# Patient Record
Sex: Female | Born: 1990 | Race: White | Hispanic: No | Marital: Married | Smoking: Never smoker
Health system: Southern US, Community
[De-identification: ages and names within clinical notes are randomized; demographics above are authoritative.]

## PROBLEM LIST (undated history)

## (undated) ENCOUNTER — Inpatient Hospital Stay (HOSPITAL_COMMUNITY): Payer: Self-pay

## (undated) DIAGNOSIS — Z9889 Other specified postprocedural states: Secondary | ICD-10-CM

## (undated) DIAGNOSIS — R1110 Vomiting, unspecified: Secondary | ICD-10-CM

## (undated) DIAGNOSIS — L501 Idiopathic urticaria: Secondary | ICD-10-CM

## (undated) DIAGNOSIS — R51 Headache: Secondary | ICD-10-CM

## (undated) DIAGNOSIS — R112 Nausea with vomiting, unspecified: Secondary | ICD-10-CM

## (undated) DIAGNOSIS — N200 Calculus of kidney: Secondary | ICD-10-CM

## (undated) DIAGNOSIS — IMO0002 Reserved for concepts with insufficient information to code with codable children: Secondary | ICD-10-CM

## (undated) DIAGNOSIS — I479 Paroxysmal tachycardia, unspecified: Secondary | ICD-10-CM

## (undated) DIAGNOSIS — D649 Anemia, unspecified: Secondary | ICD-10-CM

## (undated) DIAGNOSIS — B3741 Candidal cystitis and urethritis: Secondary | ICD-10-CM

## (undated) DIAGNOSIS — Z8619 Personal history of other infectious and parasitic diseases: Secondary | ICD-10-CM

## (undated) DIAGNOSIS — G43909 Migraine, unspecified, not intractable, without status migrainosus: Secondary | ICD-10-CM

## (undated) DIAGNOSIS — A048 Other specified bacterial intestinal infections: Secondary | ICD-10-CM

## (undated) DIAGNOSIS — K219 Gastro-esophageal reflux disease without esophagitis: Secondary | ICD-10-CM

## (undated) DIAGNOSIS — B999 Unspecified infectious disease: Secondary | ICD-10-CM

## (undated) DIAGNOSIS — B9689 Other specified bacterial agents as the cause of diseases classified elsewhere: Secondary | ICD-10-CM

## (undated) DIAGNOSIS — F419 Anxiety disorder, unspecified: Secondary | ICD-10-CM

## (undated) DIAGNOSIS — N760 Acute vaginitis: Secondary | ICD-10-CM

## (undated) HISTORY — DX: Idiopathic urticaria: L50.1

## (undated) HISTORY — DX: Anxiety disorder, unspecified: F41.9

## (undated) HISTORY — DX: Gastro-esophageal reflux disease without esophagitis: K21.9

## (undated) HISTORY — DX: Personal history of other infectious and parasitic diseases: Z86.19

## (undated) HISTORY — DX: Other specified bacterial intestinal infections: A04.8

## (undated) HISTORY — DX: Calculus of kidney: N20.0

---
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
## 2010-11-22 ENCOUNTER — Emergency Department (HOSPITAL_BASED_OUTPATIENT_CLINIC_OR_DEPARTMENT_OTHER)
Admission: EM | Admit: 2010-11-22 | Discharge: 2010-11-23 | Disposition: A | Discharge status: Home / Self Care | Payer: PRIVATE HEALTH INSURANCE | Attending: Emergency Medicine | Admitting: Emergency Medicine

## 2010-11-22 DIAGNOSIS — R109 Unspecified abdominal pain: Secondary | ICD-10-CM | POA: Insufficient documentation

## 2010-11-22 DIAGNOSIS — N9489 Other specified conditions associated with female genital organs and menstrual cycle: Secondary | ICD-10-CM | POA: Insufficient documentation

## 2010-11-22 LAB — WET PREP, GENITAL: Yeast Wet Prep HPF POC: NONE SEEN

## 2010-11-22 LAB — URINALYSIS, ROUTINE W REFLEX MICROSCOPIC
Bilirubin Urine: NEGATIVE
Glucose, UA: NEGATIVE mg/dL
Hgb urine dipstick: NEGATIVE
Protein, ur: NEGATIVE mg/dL
Urobilinogen, UA: 0.2 mg/dL (ref 0.0–1.0)

## 2010-11-25 LAB — GC/CHLAMYDIA PROBE AMP, GENITAL
Chlamydia, DNA Probe: NEGATIVE
GC Probe Amp, Genital: NEGATIVE

## 2011-08-09 ENCOUNTER — Inpatient Hospital Stay (HOSPITAL_COMMUNITY)
Admission: AD | Admit: 2011-08-09 | Discharge: 2011-08-09 | Discharge status: Against Medical Advice | Payer: PRIVATE HEALTH INSURANCE | Source: Ambulatory Visit | Attending: Obstetrics & Gynecology | Admitting: Obstetrics & Gynecology

## 2012-07-28 NOTE — L&D Delivery Note (Addendum)
Delivery Note At 2:51 AM a viable and healthy female was delivered via Vaginal, Spontaneous Delivery (Presentation: ; Occiput Anterior).  APGAR: 8, 9; weight P .   Placenta status: Intact, Spontaneous.  Cord: 3VC with the following complications: None.    Anesthesia: Epidural  Episiotomy: None Lacerations: 2nd degree perineal Suture Repair: 3.0 vicryl rapide Est. Blood Loss (mL): 450cc  Mom to postpartum.  Baby to Couplet care / Skin to Skin.  BOVARD,Yuki Purves 06/18/2013, 3:28 AM  Br/ A+ /POP/RI    With delivery, pt had mild atony.  Uterus would firm up with bimanual massage, but then get atonic again.  Given rectal cytotec for uterine tone.

## 2012-10-24 ENCOUNTER — Inpatient Hospital Stay (HOSPITAL_COMMUNITY)
Admission: AD | Admit: 2012-10-24 | Discharge: 2012-10-24 | Disposition: A | Discharge status: Home / Self Care | Payer: 59 | Source: Ambulatory Visit | Attending: Obstetrics and Gynecology | Admitting: Obstetrics and Gynecology

## 2012-10-24 ENCOUNTER — Encounter (HOSPITAL_COMMUNITY): Payer: Self-pay

## 2012-10-24 DIAGNOSIS — I479 Paroxysmal tachycardia, unspecified: Secondary | ICD-10-CM

## 2012-10-24 DIAGNOSIS — R002 Palpitations: Secondary | ICD-10-CM | POA: Insufficient documentation

## 2012-10-24 DIAGNOSIS — R000 Tachycardia, unspecified: Secondary | ICD-10-CM | POA: Insufficient documentation

## 2012-10-24 DIAGNOSIS — O2390 Unspecified genitourinary tract infection in pregnancy, unspecified trimester: Secondary | ICD-10-CM

## 2012-10-24 DIAGNOSIS — O2341 Unspecified infection of urinary tract in pregnancy, first trimester: Secondary | ICD-10-CM

## 2012-10-24 DIAGNOSIS — O2650 Maternal hypotension syndrome, unspecified trimester: Secondary | ICD-10-CM | POA: Insufficient documentation

## 2012-10-24 HISTORY — DX: Candidal cystitis and urethritis: B37.41

## 2012-10-24 HISTORY — DX: Unspecified infectious disease: B99.9

## 2012-10-24 HISTORY — DX: Other specified bacterial agents as the cause of diseases classified elsewhere: B96.89

## 2012-10-24 HISTORY — DX: Other specified bacterial agents as the cause of diseases classified elsewhere: N76.0

## 2012-10-24 HISTORY — DX: Acute vaginitis: B96.89

## 2012-10-24 HISTORY — DX: Headache: R51

## 2012-10-24 LAB — CBC
HCT: 37.9 % (ref 36.0–46.0)
Hemoglobin: 13 g/dL (ref 12.0–15.0)
MCH: 31 pg (ref 26.0–34.0)
MCHC: 34.3 g/dL (ref 30.0–36.0)
MCV: 90.5 fL (ref 78.0–100.0)
Platelets: 323 10*3/uL (ref 150–400)
RBC: 4.19 MIL/uL (ref 3.87–5.11)
RDW: 13.4 % (ref 11.5–15.5)
WBC: 9.3 10*3/uL (ref 4.0–10.5)

## 2012-10-24 LAB — URINALYSIS, ROUTINE W REFLEX MICROSCOPIC
Bilirubin Urine: NEGATIVE
Hgb urine dipstick: NEGATIVE
Nitrite: NEGATIVE
Protein, ur: NEGATIVE mg/dL
Specific Gravity, Urine: 1.015 (ref 1.005–1.030)
Urobilinogen, UA: 0.2 mg/dL (ref 0.0–1.0)

## 2012-10-24 LAB — URINE MICROSCOPIC-ADD ON

## 2012-10-24 MED ORDER — NITROFURANTOIN MONOHYD MACRO 100 MG PO CAPS: 100.0000 mg | ORAL_CAPSULE | Freq: Two times a day (BID) | ORAL | Status: DC

## 2012-10-24 NOTE — MAU Note (Signed)
Patient states she was in the shower and got hot and sat down in the shower. States she passed out and when she awoke she was disoriented and shaky. Denies pain or bleeding. Patient states she has weaned herself off Nadolol given for her migraines that she took daily. Has had headaches for 4 days.

## 2012-10-24 NOTE — MAU Note (Signed)
Patient presents to MAU with c/o feeling "shaky" and heart palpations since 1230 today when patient passed out in shower. Denies injury resulting from passing out in shower; states she was already lying down.  Reports she is weaning herself from Nadolol, which she takes for migraines. States she normally takes 40 mg and has been weaning herself from the medication per PCP.  States she has been off of med completely for 4 days. Reports she has had migraine x 4 days and heart palpations, relieved by 1000 mg Tylenol and Phenergan taken yesterday at lunch. Heart palpations and shaky feeling persists.  Denies vaginal bleeding, LOF.

## 2012-10-24 NOTE — MAU Provider Note (Signed)
History     CSN: [id number]  Arrival date & time 10/24/12  1333   None     Chief Complaint  Patient presents with  . Loss of Consciousness    (Consider location/radiation/quality/duration/timing/severity/associated sxs/prior treatment) HPI Susan Hines is a 22 y.o. G1P0 at [redacted]w[redacted]d. She presents with c/o feeling shakey and having heart palpitations since 12:30. She lost consciousness while lying in the shower for a brief period of time.  She is weaning off Corgard that she has been on x 4 yr for migraines. Last dose was 4 days ago.  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  No vaginal bleeding or spotting, has low cramping off/on x 1 1/2 wks.   Past Medical History  Diagnosis Date  . Headache   . Infection   . Yeast cystitis   . Bacterial vaginosis     Past Surgical History  Procedure Laterality Date  . Wisdom tooth extraction      No family history on file.  History  Substance Use Topics  . Smoking status: Never Smoker   . Smokeless tobacco: Never Used  . Alcohol Use: No    OB History   Grav Para Term Preterm Abortions TAB SAB Ect Mult Living   1               Review of Systems  Constitutional: Negative for fever and chills.  Gastrointestinal: Positive for nausea and constipation. Negative for vomiting and diarrhea.  Genitourinary: Positive for frequency. Negative for dysuria, urgency, vaginal bleeding and vaginal discharge.       Cramping off/on  Neurological: Negative for tremors, seizures, facial asymmetry, speech difficulty, light-headedness and headaches.    Allergies  Augmentin  Home Medications  No current outpatient prescriptions on file.  BP 126/72  Pulse 115  Temp(Src) 98.5 F (36.9 C) (Oral)  Resp 16  Ht 5' (1.524 m)  Wt 158 lb (71.668 kg)  BMI 30.86 kg/m2  SpO2 100%  LMP 09/15/2012  Physical Exam  Nursing note and vitals reviewed. Constitutional: She is oriented to person, place, and time. She appears  well-developed and well-nourished.  Pulmonary/Chest: Effort normal.  Abdominal: Soft. There is no tenderness. There is no rebound and no guarding.  Musculoskeletal: Normal range of motion.  Neurological: She is alert and oriented to person, place, and time.  Skin: Skin is warm and dry.  Psychiatric: She has a normal mood and affect. Her behavior is normal.    ED Course  Procedures (including critical care time)  Labs Reviewed  URINALYSIS, ROUTINE W REFLEX MICROSCOPIC   Results for orders placed during the hospital encounter of 10/24/12 (from the past 24 hour(s))  URINALYSIS, ROUTINE W REFLEX MICROSCOPIC     Status: Abnormal   Collection Time    10/24/12  2:41 PM      Result Value Range   Color, Urine YELLOW  YELLOW   APPearance HAZY (*) CLEAR   Specific Gravity, Urine 1.015  1.005 - 1.030   pH 5.5  5.0 - 8.0   Glucose, UA NEGATIVE  NEGATIVE mg/dL   Hgb urine dipstick NEGATIVE  NEGATIVE   Bilirubin Urine NEGATIVE  NEGATIVE   Ketones, ur NEGATIVE  NEGATIVE mg/dL   Protein, ur NEGATIVE  NEGATIVE mg/dL   Urobilinogen, UA 0.2  0.0 - 1.0 mg/dL   Nitrite NEGATIVE  NEGATIVE   Leukocytes, UA SMALL (*) NEGATIVE  URINE MICROSCOPIC-ADD ON     Status: Abnormal   Collection Time  10/24/12  2:41 PM      Result Value Range   Squamous Epithelial / LPF MANY (*) RARE   WBC, UA 7-10  <3 WBC/hpf   RBC / HPF 0-2  <3 RBC/hpf   Bacteria, UA MANY (*) RARE  CBC     Status: None   Collection Time    10/24/12  2:58 PM      Result Value Range   WBC 9.3  4.0 - 10.5 K/uL   RBC 4.19  3.87 - 5.11 MIL/uL   Hemoglobin 13.0  12.0 - 15.0 g/dL   HCT 16.1  09.6 - 04.5 %   MCV 90.5  78.0 - 100.0 fL   MCH 31.0  26.0 - 34.0 pg   MCHC 34.3  30.0 - 36.0 g/dL   RDW 40.9  81.1 - 91.4 %   Platelets 323  150 - 400 K/uL    No results found. EKG- NSR  No diagnosis found. ASSESSMENT:  Urinary tract infection- C&S pending Early pregnant  with episodes of syncope and tachycardia.  Hgb, CBG, EKG normal.  Pt  has just weaned off Corgard. Consulted with Dr Ronny Flurry, cardiologist on call- with normal results does not feel pt requires emergency intervention,  recommends w/u in the office this week.   PLAN:  Macrobid 100 mg bid x 7 d Ectopic precautions reviewed- call the office with change in symptoms Will forward pt information to Dr Raford Pitcher for appt with her Keep appt in the office for U/S and OB w/u later this month  MDM

## 2012-10-25 LAB — URINE CULTURE: Colony Count: 4000

## 2012-10-25 LAB — GLUCOSE, CAPILLARY: Glucose-Capillary: 96 mg/dL (ref 70–99)

## 2012-10-26 ENCOUNTER — Encounter: Payer: Self-pay | Admitting: Cardiovascular Disease

## 2012-10-26 ENCOUNTER — Ambulatory Visit (INDEPENDENT_AMBULATORY_CARE_PROVIDER_SITE_OTHER): Payer: 59 | Admitting: Cardiovascular Disease

## 2012-10-26 ENCOUNTER — Encounter: Payer: PRIVATE HEALTH INSURANCE | Admitting: Cardiovascular Disease

## 2012-10-26 VITALS — BP 100/80 | HR 111 | Ht 60.0 in | Wt 155.4 lb

## 2012-10-26 DIAGNOSIS — I498 Other specified cardiac arrhythmias: Secondary | ICD-10-CM

## 2012-10-26 DIAGNOSIS — R000 Tachycardia, unspecified: Secondary | ICD-10-CM | POA: Insufficient documentation

## 2012-10-26 MED ORDER — LABETALOL HCL 100 MG PO TABS: 50.0000 mg | ORAL_TABLET | Freq: Two times a day (BID) | ORAL | Status: DC

## 2012-10-26 NOTE — Progress Notes (Signed)
     [name] Date of Birth  [birth year]       [facility] Office [address], [address]  [address] [place], [place]  [address]   [place], [place]  [address] [phone]     [phone]   Fax  [phone]    Fax [phone]  Problem List: 1. Tachycardia 2. Pregnancy 3. Pre-syncope 4. Migraine headaches  History of Present Illness:  Susan Hines is a 22 yo who was worked in today for further eval of tachycardia.  She has been on Nadalol or other beta blockers  for  15  for her migraines.  She also has been "jittery".  She also has had some orthostatic hypotension.   Her diet is OK.    Current Outpatient Prescriptions on File Prior to Visit  Medication Sig Dispense Refill  . acetaminophen (TYLENOL) 500 MG tablet Take 1,000 mg by mouth every 6 (six) hours as needed for pain.      Marland Kitchen FLUoxetine (PROZAC) 20 MG tablet Take 30 mg by mouth daily.      . nitrofurantoin, macrocrystal-monohydrate, (MACROBID) 100 MG capsule Take 1 capsule (100 mg total) by mouth 2 (two) times daily.  10 capsule  0  . Prenatal Vit-Fe Fumarate-FA (PRENATAL MULTIVITAMIN) TABS Take 1 tablet by mouth daily at 12 noon.      . promethazine (PHENERGAN) 25 MG tablet Take 25 mg by mouth every 6 (six) hours as needed for nausea.      . ranitidine (ZANTAC) 150 MG tablet Take 150 mg by mouth every 12 (twelve) hours.       No current facility-administered medications on file prior to visit.    Allergies  Allergen Reactions  . Augmentin (Amoxicillin-Pot Clavulanate) Diarrhea and Itching    Past Medical History  Diagnosis Date  . Headache   . Infection   . Yeast cystitis   . Bacterial vaginosis     Past Surgical History  Procedure Laterality Date  . Wisdom tooth extraction      History  Smoking status  . Never Smoker   Smokeless tobacco  . Never Used    History  Alcohol Use No    No family history on file.  Reviw of Systems:  Reviewed in the HPI.  All  other systems are negative.  Physical Exam: Blood pressure 100/80, pulse 111, height 5' (1.524 m), weight 155 lb 6.4 oz (70.489 kg), last menstrual period 09/15/2012, SpO2 99.00%. General: Well developed, well nourished, in no acute distress.  Head: Normocephalic, atraumatic, sclera non-icteric, mucus membranes are moist,   Neck: Supple. Carotids are 2 + without bruits. No JVD   Lungs: Clear   Heart: RR, tachycardic, normal S1, S2  Abdomen: Soft, non-tender, non-distended with normal bowel sounds.  Msk:  Strength and tone are normal   Extremities: No clubbing or cyanosis. No edema.  Distal pedal pulses are 2+ and equal    Neuro: CN II - XII intact.  Alert and oriented X 3.   Psych:  Normal   ECG: October 25, 2012:  Sinus tachycardia   Assessment / Plan:

## 2012-10-26 NOTE — Patient Instructions (Addendum)
Your physician has recommended you make the following change in your medication:   RE Start labetalol 50 mg twice daily 12 hours apart/// tablets are 100 mg so cut in half  Your physician recommends that you schedule a follow-up appointment in: 1 month    Keep track of blood pressures and call with any questions, eat more salt than usual, drink V-8 juice daily

## 2012-10-26 NOTE — Assessment & Plan Note (Signed)
Susan Hines has been on beta blockers for the past 10-12 years. She's had chronic migraine headaches and was started on beta blockers in the first grade.  She was most recently on nadolol and this was tapered off when she became pregnant. Despite a taper over 2 weeks, she's having symptoms consistent with withdrawal from the beta blocker.  I think that all of her symptoms are due to this beta blocker withdrawal and I think she will feel much better if we restart a low dose beta blocker.  We'll start her on labetalol 50 mg twice a day.  This may cause her blood pressure to drop but we'll have her push fluids and a little bit of extra salt. If she is not able to tolerate the labetalol because of hypertension, we'll check with the OB doctor to see if other beta blockers may be acceptable.   I See her again in one month for followup visit. We may want to do an echocardiogram if her symptoms persist despite beta blocker therapy.

## 2012-10-28 ENCOUNTER — Encounter: Payer: PRIVATE HEALTH INSURANCE | Admitting: Cardiology

## 2012-10-28 ENCOUNTER — Telehealth: Payer: Self-pay | Admitting: Physician Assistant

## 2012-10-28 ENCOUNTER — Encounter (HOSPITAL_COMMUNITY): Payer: Self-pay | Admitting: *Deleted

## 2012-10-28 ENCOUNTER — Telehealth: Payer: Self-pay | Admitting: Cardiovascular Disease

## 2012-10-28 ENCOUNTER — Emergency Department (HOSPITAL_COMMUNITY): Payer: 59

## 2012-10-28 ENCOUNTER — Emergency Department (HOSPITAL_COMMUNITY)
Admission: EM | Admit: 2012-10-28 | Discharge: 2012-10-29 | Disposition: A | Discharge status: Home / Self Care | Payer: 59 | Attending: Emergency Medicine | Admitting: Emergency Medicine

## 2012-10-28 DIAGNOSIS — R55 Syncope and collapse: Secondary | ICD-10-CM | POA: Insufficient documentation

## 2012-10-28 DIAGNOSIS — Z8679 Personal history of other diseases of the circulatory system: Secondary | ICD-10-CM | POA: Insufficient documentation

## 2012-10-28 DIAGNOSIS — O9989 Other specified diseases and conditions complicating pregnancy, childbirth and the puerperium: Secondary | ICD-10-CM | POA: Insufficient documentation

## 2012-10-28 DIAGNOSIS — R000 Tachycardia, unspecified: Secondary | ICD-10-CM | POA: Insufficient documentation

## 2012-10-28 DIAGNOSIS — Z79899 Other long term (current) drug therapy: Secondary | ICD-10-CM | POA: Insufficient documentation

## 2012-10-28 DIAGNOSIS — R002 Palpitations: Secondary | ICD-10-CM | POA: Insufficient documentation

## 2012-10-28 DIAGNOSIS — Z3490 Encounter for supervision of normal pregnancy, unspecified, unspecified trimester: Secondary | ICD-10-CM

## 2012-10-28 DIAGNOSIS — R404 Transient alteration of awareness: Secondary | ICD-10-CM | POA: Insufficient documentation

## 2012-10-28 DIAGNOSIS — Z8619 Personal history of other infectious and parasitic diseases: Secondary | ICD-10-CM | POA: Insufficient documentation

## 2012-10-28 HISTORY — DX: Migraine, unspecified, not intractable, without status migrainosus: G43.909

## 2012-10-28 LAB — WET PREP, GENITAL: Trich, Wet Prep: NONE SEEN

## 2012-10-28 LAB — CBC WITH DIFFERENTIAL/PLATELET
Basophils Absolute: 0 10*3/uL (ref 0.0–0.1)
Basophils Relative: 0 % (ref 0–1)
MCHC: 35.2 g/dL (ref 30.0–36.0)
Neutro Abs: 6.4 10*3/uL (ref 1.7–7.7)
Neutrophils Relative %: 62 % (ref 43–77)
Platelets: 337 10*3/uL (ref 150–400)
RDW: 13.1 % (ref 11.5–15.5)
WBC: 10.5 10*3/uL (ref 4.0–10.5)

## 2012-10-28 MED ORDER — SODIUM CHLORIDE 0.9 % IV BOLUS (SEPSIS)
1000.0000 mL | Freq: Once | INTRAVENOUS | Status: AC
  Administered 2012-10-28: 1000 mL via INTRAVENOUS

## 2012-10-28 NOTE — ED Notes (Signed)
Pt. [redacted] weeks pregnant.  Pt. Passed out in shower on Sunday.  Pt. Was taking Nadolol for migraines and stopped due to pregnancy.  Since she stopped taking this pts having palpitations.  Pt. Went to [facility] on Sunday.  Pt. Went to Cardiologist Tuesday and they put her on Labetolol and her palpitations are still there and still tachy.  Pt. Feels like passing out when standing up.  Pt. States having SOB.

## 2012-10-28 NOTE — Telephone Encounter (Signed)
New Prob   Pt has an appointment earlier this week and was prescribed a medication (laetalol). Pt states med is not working and she is still having same symptoms. Would like to speak to nurse.

## 2012-10-28 NOTE — ED Provider Notes (Signed)
History     CSN: [id number]  Arrival date & time 10/28/12  2144   First MD Initiated Contact with Patient 10/28/12 2208      Chief Complaint  Patient presents with  . Loss of Consciousness  . Palpitations  . Tachycardia    (Consider location/radiation/quality/duration/timing/severity/associated sxs/prior treatment) Patient is a 22 y.o. female presenting with syncope and palpitations.  Loss of Consciousness  Associated symptoms include palpitations.  Palpitations  Associated symptoms include syncope.   Pt G1P0 approx 6wks Pt with history of chronic migraines has been well controlled on Nadolol for years but had to taper off last week due to being pregnant. She had a syncopal episode about 4 days ago, has had palpitations and tachycardia since then. She was seen at [facility] for same on Monday and symptoms were attributed to stopping the beta-blocker. She had labs done, but no quant or [place]. She was subsequently seen at [facility] where she had labetalol started at low dose. She reports continued tachycardia, palpiations, jitteriness. She has had some anterior, lower abdominal cramping.   Past Medical History  Diagnosis Date  . Headache   . Infection   . Yeast cystitis   . Bacterial vaginosis   . Migraines     Past Surgical History  Procedure Laterality Date  . Wisdom tooth extraction      No family history on file.  History  Substance Use Topics  . Smoking status: Never Smoker   . Smokeless tobacco: Never Used  . Alcohol Use: No    OB History   Grav Para Term Preterm Abortions TAB SAB Ect Mult Living   1               Review of Systems  Cardiovascular: Positive for palpitations and syncope.   All other systems reviewed and are negative except as noted in HPI.   Allergies  Augmentin  Home Medications   Current Outpatient Rx  Name  Route  Sig  Dispense  Refill  . acetaminophen (TYLENOL) 500 MG tablet   Oral   Take 1,000 mg by mouth every 6  (six) hours as needed for pain.         Marland Kitchen FLUoxetine (PROZAC) 20 MG tablet   Oral   Take 30 mg by mouth daily.         Marland Kitchen labetalol (NORMODYNE) 100 MG tablet   Oral   Take 50 mg by mouth 2 (two) times daily.         . nitrofurantoin, macrocrystal-monohydrate, (MACROBID) 100 MG capsule   Oral   Take 100 mg by mouth 2 (two) times daily.         . Prenatal Vit-Fe Fumarate-FA (PRENATAL MULTIVITAMIN) TABS   Oral   Take 1 tablet by mouth daily.          . promethazine (PHENERGAN) 25 MG tablet   Oral   Take 25 mg by mouth every 6 (six) hours as needed for nausea. For nausea         . ranitidine (ZANTAC) 150 MG tablet   Oral   Take 150 mg by mouth at bedtime.            BP 139/78  Pulse 122  Temp(Src) 98.1 F (36.7 C) (Oral)  Resp 16  SpO2 98%  LMP 09/15/2012  Physical Exam  Nursing note and vitals reviewed. Constitutional: She is oriented to person, place, and time. She appears well-developed and well-nourished.  HENT:  Head: Normocephalic and  atraumatic.  Eyes: EOM are normal. Pupils are equal, round, and reactive to light.  Neck: Normal range of motion. Neck supple.  Cardiovascular: Normal rate, normal heart sounds and intact distal pulses.   Pulmonary/Chest: Effort normal and breath sounds normal.  Abdominal: Bowel sounds are normal. She exhibits no distension. There is tenderness (mild L lower). There is no rebound and no guarding.  Genitourinary: Uterus is not tender. Cervix exhibits no motion tenderness and no friability. Right adnexum displays no mass and no tenderness. Left adnexum displays no mass and no tenderness. No bleeding around the vagina. Vaginal discharge (mild, thin white) found.  Musculoskeletal: Normal range of motion. She exhibits no edema and no tenderness.  Neurological: She is alert and oriented to person, place, and time. She has normal strength. No cranial nerve deficit or sensory deficit.  Skin: Skin is warm and dry. No rash noted.   Psychiatric: She has a normal mood and affect.    ED Course  Procedures (including critical care time)  Labs Reviewed  WET PREP, GENITAL - Abnormal; Notable for the following:    Clue Cells Wet Prep HPF POC MODERATE (*)    All other components within normal limits  URINALYSIS, ROUTINE W REFLEX MICROSCOPIC - Abnormal; Notable for the following:    Ketones, ur 15 (*)    All other components within normal limits  HCG, QUANTITATIVE, PREGNANCY - Abnormal; Notable for the following:    hCG, Beta Chain, Quant, [place] [address] (*)    All other components within normal limits  GC/CHLAMYDIA PROBE AMP  CBC WITH DIFFERENTIAL   US Ob Comp Less 14 Wks  10/29/2012  *RADIOLOGY REPORT*  Clinical Data: Syncope  OBSTETRIC <14 WK [place] AND TRANSVAGINAL OB US  Technique:  Both transabdominal and transvaginal ultrasound examinations were performed for complete evaluation of the gestation as well as the maternal uterus, adnexal regions, and pelvic cul-de-sac.  Transvaginal technique was performed to assess early pregnancy.  Comparison:  None.  Intrauterine gestational sac:  Single and fundal Yolk sac: Present Embryo: Present Cardiac Activity: Present Heart Rate: 116 bpm  MSD:  mm   w  d CRL: 3.7  mm  six w  one d          [place] EDC: 06/23/2013  Maternal uterus/adnexae: Ovaries were not visualized.  No free fluid.  No pelvic mass. No subchorionic hemorrhage.  IMPRESSION: Single live intrauterine gestation with an estimated age of 6 weeks and 1 day.  The heart rate is 116 beats per minute.  Ovaries were not visualized.   Original Report Authenticated By: Jolaine Click, M.D.    US Ob Transvaginal  10/29/2012  *RADIOLOGY REPORT*  Clinical Data: Syncope  OBSTETRIC <14 WK [place] AND TRANSVAGINAL OB US  Technique:  Both transabdominal and transvaginal ultrasound examinations were performed for complete evaluation of the gestation as well as the maternal uterus, adnexal regions, and pelvic cul-de-sac.  Transvaginal technique was performed to  assess early pregnancy.  Comparison:  None.  Intrauterine gestational sac:  Single and fundal Yolk sac: Present Embryo: Present Cardiac Activity: Present Heart Rate: 116 bpm  MSD:  mm   w  d CRL: 3.7  mm  six w  one d          [place] EDC: 06/23/2013  Maternal uterus/adnexae: Ovaries were not visualized.  No free fluid.  No pelvic mass. No subchorionic hemorrhage.  IMPRESSION: Single live intrauterine gestation with an estimated age of 6 weeks and 1 day.  The heart  rate is 116 beats per minute.  Ovaries were not visualized.   Original Report Authenticated By: Jolaine Click, M.D.      1. Normal IUP (intrauterine pregnancy) on prenatal ultrasound   2. Palpitations       MDM  Pt with early pregnancy, syncope and continued tachycardia. Will check for ectopic.    Date: 10/28/2012  Rate: 100  Rhythm: sinus tachycardia  QRS Axis: normal  Intervals: normal  ST/T Wave abnormalities: normal  Conduction Disutrbances: none  Narrative Interpretation: unremarkable  [place] as above, shows IUP. HR improved but BP is borderline. Pt asking for something to help her relax. Will give Ativan as opposed to additional B-block. Advised interval increase in Labetalol as BP tolerates. Cards followup.         Charles B. Bernette Mayers, MD 10/29/12 [phone]

## 2012-10-28 NOTE — Telephone Encounter (Addendum)
Patient called re: persistently elevated HR 130-140s with presyncope on standing. She has a history of migraines and sinus tachycardia manifested as presyncope and "jitters." She had been on chronic beta blockers which were recently discontinued. She is pregnant and is more concerned about these symptoms with her pregnancy. She was recently started on low-dose labetolol for HR control. She states her BP is low 100s/70-80s, rate 130-140s.. She reports that every time she has tried to stand up for the past several hours, she has felt as if she is going to pass out. She has been eating more salt and drinking plenty of fluids. Labetolol likely not providing adequate rate control, and would avoid an additional PRN dose to further drop her BP and potentially promote syncope, especially while pregnant.  Likely needs alternative B-1 selective BB/CCB for increased rate-control. With her persistent presyncope and tachycardia today, advised to have someone transport her to the ED for further evaluation. She prefers to present to [facility] ED. She understood and agreed.    Jacqulyn Bath, PA-C 10/28/2012 5:45 PM

## 2012-10-28 NOTE — Telephone Encounter (Signed)
Left msg I will try to reach her tomorrow

## 2012-10-29 LAB — URINALYSIS, ROUTINE W REFLEX MICROSCOPIC
Bilirubin Urine: NEGATIVE
Leukocytes, UA: NEGATIVE
Nitrite: NEGATIVE
Specific Gravity, Urine: 1.022 (ref 1.005–1.030)
Urobilinogen, UA: 1 mg/dL (ref 0.0–1.0)

## 2012-10-29 LAB — GC/CHLAMYDIA PROBE AMP
CT Probe RNA: NEGATIVE
GC Probe RNA: NEGATIVE

## 2012-10-29 MED ORDER — LORAZEPAM 1 MG PO TABS
1.0000 mg | ORAL_TABLET | Freq: Once | ORAL | Status: AC
  Administered 2012-10-29: 1 mg via ORAL
  Filled 2012-10-29: qty 1

## 2012-10-29 MED ORDER — LABETALOL HCL 100 MG PO TABS: 50.0000 mg | ORAL_TABLET | Freq: Two times a day (BID) | ORAL | Status: DC

## 2012-10-29 NOTE — Telephone Encounter (Signed)
She has not tried the labetalol long enough to start switching.  Agree with increasing the dose , increase salt and water intake to prevent the BP from going too low.

## 2012-10-29 NOTE — Telephone Encounter (Signed)
Pt was seen in ED last evening, reviewed events last night, pt DENIED syncope and states she was dizzy and light headed but never lost consciousness. Pt has only been on labetalol 50 mg BID for a couple days, she was told that she could increase to 100 mg BID by ED and Dr Karn Pickler also agreed but she will have to counteract her SX of hypotension/ dizziness  With at least 60 oz water daily, V-8 juice daily, saltine crackers and she needs to monitor BP/P daily and when symptomatic, reviewed how she is to proceed and was encouraged to call with any questions or concerns. She agreed to plan.

## 2012-11-18 MED ORDER — LABETALOL HCL 100 MG PO TABS: ORAL_TABLET | ORAL | Status: DC

## 2012-11-18 NOTE — Telephone Encounter (Signed)
I spoke with the pt and she needs a new prescription sent to the pharmacy for Labetalol. Per previous documentation in this note it looks like the pt was taking 100mg  twice a day. I spoke with the pt and she said that she takes Labetalol 50mg  in the morning and 100mg  in the evening. Rx sent to the pharmacy per the pt's request.

## 2012-11-18 NOTE — Telephone Encounter (Signed)
Follow Up     Pt following up on new prescription for LABETALOL. Would like to speak to nurse regarding prescription being sent to pharmacy.

## 2012-11-24 LAB — OB RESULTS CONSOLE ABO/RH: RH Type: POSITIVE

## 2012-11-24 LAB — OB RESULTS CONSOLE GC/CHLAMYDIA
Chlamydia: NEGATIVE
Gonorrhea: NEGATIVE

## 2012-11-24 LAB — OB RESULTS CONSOLE HIV ANTIBODY (ROUTINE TESTING): HIV: NONREACTIVE

## 2012-11-24 LAB — OB RESULTS CONSOLE RPR: RPR: NONREACTIVE

## 2012-11-25 ENCOUNTER — Ambulatory Visit (INDEPENDENT_AMBULATORY_CARE_PROVIDER_SITE_OTHER): Payer: 59 | Admitting: Cardiovascular Disease

## 2012-11-25 ENCOUNTER — Encounter: Payer: Self-pay | Admitting: Cardiovascular Disease

## 2012-11-25 VITALS — BP 118/78 | HR 88 | Ht 60.0 in | Wt 157.2 lb

## 2012-11-25 DIAGNOSIS — I498 Other specified cardiac arrhythmias: Secondary | ICD-10-CM

## 2012-11-25 DIAGNOSIS — R000 Tachycardia, unspecified: Secondary | ICD-10-CM

## 2012-11-25 MED ORDER — LABETALOL HCL 100 MG PO TABS: ORAL_TABLET | ORAL | Status: DC

## 2012-11-25 NOTE — Assessment & Plan Note (Signed)
Her HR is still a bit elevated.  She is still having some lightheadedness.  I reassured her that her lightheadedness was not due to her HR of 90-100 and that her BP was also OK.  She has been given the OK to restart her Nadalol by her OB doctor.  I would like to try a slightly higher dose of labetalol first - she was started on only 50 BID.  i will see her back in several months.

## 2012-11-25 NOTE — Patient Instructions (Addendum)
Your physician has recommended you make the following change in your medication:  INCREASE LABETALOL TO 100 MG TWICE DAILY 12 HOURS APART  Your physician recommends that you schedule a follow-up appointment in: 2 MONTHS

## 2012-11-25 NOTE — Progress Notes (Signed)
     Susan Hines Date of Birth  1990-10-24       [facility] Office [address], [address]  [address] [place], [place]  [address]   [place], [place]  [address] [phone]     [phone]   Fax  [phone]    Fax [phone]  Problem List: 1. Tachycardia 2. Pregnancy 3. Pre-syncope 4. Migraine headaches  History of Present Illness:  Susan Hines is a 22 yo who was worked in today for further eval of tachycardia.  She has been on Nadalol or other beta blockers  for  15  for her migraines.  She also has been "jittery".  She also has had some orthostatic hypotension.   Her diet is OK.   May 1 ,2014:  We started her on Labetalol during her last visit.  She is now [redacted] weeks pregnant.  She still have episode of lightheadedness.  She thinks its due to her HR of 90-100.   She does have some dyspnea.   Current Outpatient Prescriptions on File Prior to Visit  Medication Sig Dispense Refill  . acetaminophen (TYLENOL) 500 MG tablet Take 1,000 mg by mouth every 6 (six) hours as needed for pain.      Marland Kitchen FLUoxetine (PROZAC) 20 MG tablet Take 30 mg by mouth daily.      Marland Kitchen labetalol (NORMODYNE) 100 MG tablet Take 50mg  in the AM and 100mg  in the PM  45 tablet  6  . nitrofurantoin, macrocrystal-monohydrate, (MACROBID) 100 MG capsule Take 100 mg by mouth 2 (two) times daily.      . Prenatal Vit-Fe Fumarate-FA (PRENATAL MULTIVITAMIN) TABS Take 1 tablet by mouth daily.       . promethazine (PHENERGAN) 25 MG tablet Take 25 mg by mouth every 6 (six) hours as needed for nausea. For nausea      . ranitidine (ZANTAC) 150 MG tablet Take 150 mg by mouth at bedtime.        No current facility-administered medications on file prior to visit.    Allergies  Allergen Reactions  . Augmentin (Amoxicillin-Pot Clavulanate) Diarrhea and Itching    Past Medical History  Diagnosis Date  . Headache   . Infection   . Yeast cystitis   . Bacterial vaginosis   .  Migraines     Past Surgical History  Procedure Laterality Date  . Wisdom tooth extraction      History  Smoking status  . Never Smoker   Smokeless tobacco  . Never Used    History  Alcohol Use No    No family history on file.  Reviw of Systems:  Reviewed in the HPI.  All other systems are negative.  Physical Exam: Last menstrual period 09/15/2012. General: Well developed, well nourished, in no acute distress.  Head: Normocephalic, atraumatic, sclera non-icteric, mucus membranes are moist,   Neck: Supple. Carotids are 2 + without bruits. No JVD   Lungs: Clear   Heart: RR, tachycardic, normal S1, S2  Abdomen: Soft, non-tender, non-distended with normal bowel sounds.  Msk:  Strength and tone are normal   Extremities: No clubbing or cyanosis. No edema.  Distal pedal pulses are 2+ and equal    Neuro: CN II - XII intact.  Alert and oriented X 3.   Psych:  Normal   ECG: October 25, 2012:  Sinus tachycardia   Assessment / Plan:

## 2012-12-18 ENCOUNTER — Encounter (HOSPITAL_COMMUNITY): Payer: Self-pay | Admitting: *Deleted

## 2012-12-18 ENCOUNTER — Inpatient Hospital Stay (HOSPITAL_COMMUNITY)
Admission: AD | Admit: 2012-12-18 | Discharge: 2012-12-19 | Disposition: A | Discharge status: Home / Self Care | Payer: 59 | Source: Ambulatory Visit | Attending: Obstetrics and Gynecology | Admitting: Obstetrics and Gynecology

## 2012-12-18 DIAGNOSIS — R300 Dysuria: Secondary | ICD-10-CM | POA: Insufficient documentation

## 2012-12-18 DIAGNOSIS — O210 Mild hyperemesis gravidarum: Secondary | ICD-10-CM | POA: Insufficient documentation

## 2012-12-18 DIAGNOSIS — O26892 Other specified pregnancy related conditions, second trimester: Secondary | ICD-10-CM

## 2012-12-18 HISTORY — DX: Paroxysmal tachycardia, unspecified: I47.9

## 2012-12-18 MED ORDER — NITROFURANTOIN MONOHYD MACRO 100 MG PO CAPS: 100.0000 mg | ORAL_CAPSULE | Freq: Two times a day (BID) | ORAL | Status: DC

## 2012-12-18 MED ORDER — ONDANSETRON HCL 4 MG/2ML IJ SOLN
4.0000 mg | Freq: Once | INTRAMUSCULAR | Status: AC
  Administered 2012-12-18: 4 mg via INTRAVENOUS
  Filled 2012-12-18: qty 2

## 2012-12-18 MED ORDER — LACTATED RINGERS IV BOLUS (SEPSIS)
1000.0000 mL | Freq: Once | INTRAVENOUS | Status: AC
  Administered 2012-12-18: 1000 mL via INTRAVENOUS

## 2012-12-18 NOTE — MAU Provider Note (Signed)
History     CSN: [id number]  Arrival date and time: 12/18/12 2125   None     Chief Complaint  Patient presents with  . Emesis During Pregnancy  . Dysuria   HPI  Pt is a G1P0 here at 13.3 wks IUP with hyperemesis of pregnancy that has worsened over the past three days.  No report of fever, body aches, or chills.  Zofran and phenergan are no longer helping.  No recent exposure to ill individuals.  Unable to hold anything down for past three days.    Past Medical History  Diagnosis Date  . Headache   . Infection   . Yeast cystitis   . Bacterial vaginosis   . Migraines   . Depression   . Paroxysmal tachycardia     Past Surgical History  Procedure Laterality Date  . Wisdom tooth extraction      Family History  Problem Relation Age of Onset  . Heart disease Maternal Grandmother   . Heart disease Maternal Grandfather   . Cancer Paternal Grandmother     History  Substance Use Topics  . Smoking status: Never Smoker   . Smokeless tobacco: Never Used  . Alcohol Use: No    Allergies:  Allergies  Allergen Reactions  . Augmentin (Amoxicillin-Pot Clavulanate) Diarrhea and Itching    Prescriptions prior to admission  Medication Sig Dispense Refill  . acetaminophen (TYLENOL) 500 MG tablet Take 1,000 mg by mouth every 6 (six) hours as needed for pain.      Marland Kitchen FLUoxetine (PROZAC) 20 MG tablet Take 20 mg by mouth daily.       Marland Kitchen labetalol (NORMODYNE) 100 MG tablet Take 100mg  in the AM and 100mg  in the PM 12 hours apart  60 tablet  6  . omeprazole (PRILOSEC) 20 MG capsule Take 20 mg by mouth every morning.      . Prenatal Vit-Fe Fumarate-FA (PRENATAL MULTIVITAMIN) TABS Take 1 tablet by mouth daily.         Review of Systems  Constitutional: Negative for fever and chills.  Gastrointestinal: Positive for nausea, vomiting, abdominal pain and diarrhea (right groin pain; soft loose stools every 3-4 hours x one week.  ).  Genitourinary: Positive for dysuria. Negative for  hematuria and flank pain.  Musculoskeletal: Negative for back pain.  Neurological: Negative for headaches.  All other systems reviewed and are negative.   Physical Exam   Blood pressure 119/59, pulse 115, temperature 98.2 F (36.8 C), temperature source Oral, resp. rate 18, height 5' (1.524 m), weight 70.67 kg (155 lb 12.8 oz), last menstrual period 09/15/2012, SpO2 100.00%.  Physical Exam  Constitutional: She is oriented to person, place, and time. She appears well-developed and well-nourished. No distress.  HENT:  Head: Normocephalic.  Mouth/Throat: Mucous membranes are dry.  Neck: Normal range of motion. Neck supple.  Cardiovascular: Normal rate, regular rhythm and normal heart sounds.   Respiratory: Effort normal and breath sounds normal.  GI: Soft. There is no tenderness. There is no rebound and no guarding.  Genitourinary: No bleeding around the vagina.  Musculoskeletal: Normal range of motion. She exhibits no edema.  Neurological: She is alert and oriented to person, place, and time. She has normal reflexes.  Skin: Skin is warm and dry. She is not diaphoretic.    MAU Course  Procedures IV LR w/Zofran 4 mg IV  2335 Pt up in bed eating crackers and drinking juice.    Results for orders placed during the hospital encounter of  12/18/12 (from the past 24 hour(s))  URINALYSIS, ROUTINE W REFLEX MICROSCOPIC     Status: Abnormal   Collection Time    12/18/12 11:10 PM      Result Value Range   Color, Urine YELLOW  YELLOW   APPearance CLOUDY (*) CLEAR   Specific Gravity, Urine 1.010  1.005 - 1.030   pH 6.0  5.0 - 8.0   Glucose, UA NEGATIVE  NEGATIVE mg/dL   Hgb urine dipstick NEGATIVE  NEGATIVE   Bilirubin Urine NEGATIVE  NEGATIVE   Ketones, ur NEGATIVE  NEGATIVE mg/dL   Protein, ur NEGATIVE  NEGATIVE mg/dL   Urobilinogen, UA 0.2  0.0 - 1.0 mg/dL   Nitrite NEGATIVE  NEGATIVE   Leukocytes, UA NEGATIVE  NEGATIVE    Assessment and Plan  Hyperemesis in  Pregnancy Dysuria  Plan: DC to home RX Macrobid Urine Culture  [facility] 12/18/2012, 10:20 PM

## 2012-12-18 NOTE — MAU Note (Signed)
Pt unable to void currently

## 2012-12-18 NOTE — MAU Note (Signed)
Have had n/v entire pregnancy but for past 3 days have not been able to keep down anything. May have uti - burns when I pee

## 2012-12-19 LAB — URINALYSIS, ROUTINE W REFLEX MICROSCOPIC
Bilirubin Urine: NEGATIVE
Ketones, ur: NEGATIVE mg/dL
Leukocytes, UA: NEGATIVE
Nitrite: NEGATIVE
Urobilinogen, UA: 0.2 mg/dL (ref 0.0–1.0)
pH: 6 (ref 5.0–8.0)

## 2012-12-19 NOTE — Progress Notes (Signed)
Written and verbal d/c instructions given and understanding voiced. 

## 2012-12-20 LAB — URINE CULTURE

## 2012-12-24 ENCOUNTER — Encounter (HOSPITAL_COMMUNITY): Payer: Self-pay | Admitting: Family

## 2012-12-24 ENCOUNTER — Inpatient Hospital Stay (HOSPITAL_COMMUNITY)
Admission: AD | Admit: 2012-12-24 | Discharge: 2012-12-24 | Disposition: A | Discharge status: Home / Self Care | Payer: 59 | Source: Ambulatory Visit | Attending: Obstetrics and Gynecology | Admitting: Obstetrics and Gynecology

## 2012-12-24 DIAGNOSIS — O210 Mild hyperemesis gravidarum: Secondary | ICD-10-CM | POA: Insufficient documentation

## 2012-12-24 DIAGNOSIS — O219 Vomiting of pregnancy, unspecified: Secondary | ICD-10-CM

## 2012-12-24 LAB — URINALYSIS, ROUTINE W REFLEX MICROSCOPIC
Glucose, UA: NEGATIVE mg/dL
Nitrite: NEGATIVE
Specific Gravity, Urine: 1.025 (ref 1.005–1.030)
pH: 6.5 (ref 5.0–8.0)

## 2012-12-24 LAB — URINE MICROSCOPIC-ADD ON

## 2012-12-24 MED ORDER — LACTATED RINGERS IV BOLUS (SEPSIS)
1000.0000 mL | Freq: Once | INTRAVENOUS | Status: AC
  Administered 2012-12-24: 1000 mL via INTRAVENOUS

## 2012-12-24 MED ORDER — ONDANSETRON 8 MG/NS 50 ML IVPB
8.0000 mg | Freq: Once | INTRAVENOUS | Status: AC
  Administered 2012-12-24: 8 mg via INTRAVENOUS
  Filled 2012-12-24: qty 8

## 2012-12-24 MED ORDER — PREDNISONE (PAK) 10 MG PO TABS: ORAL_TABLET | ORAL | Status: DC

## 2012-12-24 NOTE — MAU Provider Note (Signed)
History     CSN: [id number]  Arrival date and time: 12/24/12 1226   First Provider Initiated Contact with Patient 12/24/12 1314      Chief Complaint  Patient presents with  . Morning Sickness   HPI 22 y.o. G1P0 at [redacted]w[redacted]d with ongoing n/v, worsening over last 2-3 weeks. States she has Zofran and Phenergan at home which have not been working well for her. States she can't keep anything down. Sent from office today for IV hydration by Dr. Ellyn Hack. Seen in MAU last Saturday with same c/o - had IV fluids with Zofran. States she thought she had a UTI, but urine culture from Saturday was negative. Dr. Ellyn Hack gave rx today for yeast and bv.   Past Medical History  Diagnosis Date  . Headache(784.0)   . Infection   . Yeast cystitis   . Bacterial vaginosis   . Migraines   . Depression   . Paroxysmal tachycardia     Past Surgical History  Procedure Laterality Date  . Wisdom tooth extraction      Family History  Problem Relation Age of Onset  . Heart disease Maternal Grandmother   . Heart disease Maternal Grandfather   . Cancer Paternal Grandmother     History  Substance Use Topics  . Smoking status: Never Smoker   . Smokeless tobacco: Never Used  . Alcohol Use: No    Allergies:  Allergies  Allergen Reactions  . Augmentin (Amoxicillin-Pot Clavulanate) Diarrhea and Itching    Prescriptions prior to admission  Medication Sig Dispense Refill  . acetaminophen (TYLENOL) 500 MG tablet Take 1,000 mg by mouth every 6 (six) hours as needed for pain.      Marland Kitchen labetalol (NORMODYNE) 100 MG tablet Take 100 mg by mouth 2 (two) times daily. 12 hours apart      . omeprazole (PRILOSEC) 20 MG capsule Take 20 mg by mouth every morning.      . Prenatal Vit-Fe Fumarate-FA (PRENATAL MULTIVITAMIN) TABS Take 1 tablet by mouth daily.       . [DISCONTINUED] labetalol (NORMODYNE) 100 MG tablet Take 100mg  in the AM and 100mg  in the PM 12 hours apart  60 tablet  6  . nitrofurantoin,  macrocrystal-monohydrate, (MACROBID) 100 MG capsule Take 1 capsule (100 mg total) by mouth 2 (two) times daily.  14 capsule  0    Review of Systems  Constitutional: Negative.   Respiratory: Negative.   Cardiovascular: Negative.   Gastrointestinal: Positive for nausea, vomiting, abdominal pain and diarrhea. Negative for constipation.  Genitourinary: Negative for dysuria, urgency, frequency, hematuria and flank pain.       Negative for vaginal bleeding, vaginal discharge  Musculoskeletal: Positive for back pain.  Neurological: Negative.   Psychiatric/Behavioral: Negative.    Physical Exam   Blood pressure 119/66, pulse 111, temperature 98.2 F (36.8 C), temperature source Oral, resp. rate 16, height 4' 10.25" (1.48 m), weight 153 lb 12.8 oz (69.763 kg), last menstrual period 09/15/2012.  Physical Exam  Nursing note and vitals reviewed. Constitutional: She is oriented to person, place, and time. She appears well-developed and well-nourished. No distress.  Cardiovascular: Normal rate.   Respiratory: Effort normal.  Musculoskeletal: Normal range of motion.  Neurological: She is alert and oriented to person, place, and time.  Skin: Skin is warm and dry.  Psychiatric: She has a normal mood and affect.    MAU Course  Procedures Results for orders placed during the hospital encounter of 12/24/12 (from the past 24 hour(s))  URINALYSIS,  ROUTINE W REFLEX MICROSCOPIC     Status: Abnormal   Collection Time    12/24/12  1:05 PM      Result Value Range   Color, Urine YELLOW  YELLOW   APPearance CLOUDY (*) CLEAR   Specific Gravity, Urine 1.025  1.005 - 1.030   pH 6.5  5.0 - 8.0   Glucose, UA NEGATIVE  NEGATIVE mg/dL   Hgb urine dipstick NEGATIVE  NEGATIVE   Bilirubin Urine NEGATIVE  NEGATIVE   Ketones, ur 15 (*) NEGATIVE mg/dL   Protein, ur 30 (*) NEGATIVE mg/dL   Urobilinogen, UA 0.2  0.0 - 1.0 mg/dL   Nitrite NEGATIVE  NEGATIVE   Leukocytes, UA SMALL (*) NEGATIVE  URINE  MICROSCOPIC-ADD ON     Status: Abnormal   Collection Time    12/24/12  1:05 PM      Result Value Range   Squamous Epithelial / LPF MANY (*) RARE   WBC, UA 3-6  <3 WBC/hpf   Bacteria, UA MANY (*) RARE   Urine-Other MUCOUS PRESENT     1 L bolus LR and Zofran 8 mg IV, pt tolerating ice chips and states she is feeling better  Assessment and Plan   1. Nausea and vomiting in pregnancy prior to [redacted] weeks gestation   Pt Dr. Emeline Darling orders, pt given rx for prednisone taper as below. F/U in office as scheduled.     Medication List    TAKE these medications       acetaminophen 500 MG tablet  Commonly known as:  TYLENOL  Take 1,000 mg by mouth every 6 (six) hours as needed for pain.     labetalol 100 MG tablet  Commonly known as:  NORMODYNE  Take 100 mg by mouth 2 (two) times daily. 12 hours apart     nitrofurantoin (macrocrystal-monohydrate) 100 MG capsule  Commonly known as:  MACROBID  Take 1 capsule (100 mg total) by mouth 2 (two) times daily.     omeprazole 20 MG capsule  Commonly known as:  PRILOSEC  Take 20 mg by mouth every morning.     predniSONE 10 MG tablet  Commonly known as:  STERAPRED UNI-PAK  4 tabs po on day 1, then 2 tabs po daily x 3 days, 1 tab po daily x 3 days, then 1/2 tab po daily x 7 days     prenatal multivitamin Tabs  Take 1 tablet by mouth daily.            Follow-up Information   Follow up with BOVARD,JODY, MD. (as scheduled)    Contact information:   [address] [place] [address] [phone]         FRAZIER,NATALIE 12/24/2012, 1:36 PM

## 2012-12-24 NOTE — MAU Note (Signed)
Dr sent her over for fluids, hasn't been able to keep anything down. (thrown up x1 today. Hasn't eaten or tried to eat anything yet). Is taking meds.

## 2012-12-24 NOTE — MAU Note (Signed)
Patient presents to MAU with c/o N/V progressing for last 3 weeks; was sent over from Bovard's office today. Reports she was seen on Saturday here for same problem and was treated with fluids and Zofran.  Reports she has Rx for Zofran, Phenergan - s/s unrelieved with both. Last sips of liquid with Phenergan at 0930 today. Emesis x 5-6 in last 24 hours.  Denies vaginal bleeding, LOF.

## 2012-12-28 LAB — URINE CULTURE

## 2013-01-21 ENCOUNTER — Ambulatory Visit (INDEPENDENT_AMBULATORY_CARE_PROVIDER_SITE_OTHER): Payer: 59 | Admitting: Cardiovascular Disease

## 2013-01-21 ENCOUNTER — Encounter: Payer: Self-pay | Admitting: Cardiovascular Disease

## 2013-01-21 VITALS — BP 96/76 | HR 111 | Ht 60.0 in | Wt 155.1 lb

## 2013-01-21 DIAGNOSIS — I498 Other specified cardiac arrhythmias: Secondary | ICD-10-CM

## 2013-01-21 DIAGNOSIS — R000 Tachycardia, unspecified: Secondary | ICD-10-CM

## 2013-01-21 DIAGNOSIS — R0600 Dyspnea, unspecified: Secondary | ICD-10-CM

## 2013-01-21 MED ORDER — METOPROLOL TARTRATE 25 MG PO TABS: 25.0000 mg | ORAL_TABLET | Freq: Two times a day (BID) | ORAL | Status: DC

## 2013-01-21 NOTE — Progress Notes (Signed)
     [name] Date of Birth  1990/12/04       [facility] Office [address]  [address] [place], [place]  [address]   [place], [place]  [address] [phone]     [phone]   Fax  [phone]    Fax [phone]  Problem List: 1. Tachycardia 2. Pregnancy 3. Pre-syncope 4. Migraine headaches  History of Present Illness:  Susan Hines is a 22 yo who was worked in today for further eval of tachycardia.  She has been on Nadalol or other beta blockers  for  15  for her migraines.  She also has been "jittery".  She also has had some orthostatic hypotension.   Her diet is OK.   May 1 ,2014:  We started her on Labetalol during her last visit.  She is now [redacted] weeks pregnant.  She still have episode of lightheadedness.  She thinks its due to her HR of 90-100.   She does have some dyspnea.   January 21, 2013:  She still has palpitations.    Current Outpatient Prescriptions on File Prior to Visit  Medication Sig Dispense Refill  . acetaminophen (TYLENOL) 500 MG tablet Take 1,000 mg by mouth every 6 (six) hours as needed for pain.      Marland Kitchen labetalol (NORMODYNE) 100 MG tablet Take 100 mg by mouth 2 (two) times daily. 12 hours apart      . omeprazole (PRILOSEC) 20 MG capsule Take 20 mg by mouth every morning.      . Prenatal Vit-Fe Fumarate-FA (PRENATAL MULTIVITAMIN) TABS Take 1 tablet by mouth daily.        No current facility-administered medications on file prior to visit.    Allergies  Allergen Reactions  . Augmentin (Amoxicillin-Pot Clavulanate) Diarrhea and Itching    Past Medical History  Diagnosis Date  . Headache(784.0)   . Infection   . Yeast cystitis   . Bacterial vaginosis   . Migraines   . Depression   . Paroxysmal tachycardia     Past Surgical History  Procedure Laterality Date  . Wisdom tooth extraction      History  Smoking status  . Never Smoker   Smokeless tobacco  . Never Used    History   Alcohol Use No    Family History  Problem Relation Age of Onset  . Heart disease Maternal Grandmother   . Heart disease Maternal Grandfather   . Cancer Paternal Grandmother     Reviw of Systems:  Reviewed in the HPI.  All other systems are negative.  Physical Exam: Blood pressure 96/76, pulse 111, height 5' (1.524 m), weight 155 lb 1.9 oz (70.362 kg), last menstrual period 09/15/2012, SpO2 98.00%. General: Well developed, well nourished, in no acute distress.  Head: Normocephalic, atraumatic, sclera non-icteric, mucus membranes are moist,   Neck: Supple. Carotids are 2 + without bruits. No JVD   Lungs: Clear   Heart: RR, tachycardic, normal S1, S2  Abdomen: Soft, non-tender, non-distended with normal bowel sounds.  Msk:  Strength and tone are normal   Extremities: No clubbing or cyanosis. No edema.  Distal pedal pulses are 2+ and equal    Neuro: CN II - XII intact.  Alert and oriented X 3.   Psych:  Normal   ECG: October 25, 2012:  Sinus tachycardia   Assessment / Plan:

## 2013-01-21 NOTE — Assessment & Plan Note (Signed)
Susan Hines continues to have tachypalpitations and dyspnea with exertion.  Her BP has been low since we started the Labetolol.  She has not noticed any significant improvement in her tachycardia.  We will DC her Labetalol and start her on metoprolol 25 BID.  I have discussed this with her OB (Dr. Ellyn Hack [phone]).  Hopefully the metoprolol with slow her HR at a greater degree than the Labetalol.    We will get an echocardiogram to evaluate her dyspnea.      She is to keep a record of her HR and BP every day .  I will see her in 1 month for OV.

## 2013-01-21 NOTE — Patient Instructions (Addendum)
Your physician has recommended you make the following change in your medication:   Stop LABETALOL START METOPROLOL 25 MG 2 TIMES A DAY 12 HOURS APART/// STAY WELL HYDRATED  GET BP/P DAILY AND BRING RECORDINGS WITH YOU NEXT OV  Your physician has requested that you have an echocardiogram. Echocardiography is a painless test that uses sound waves to create images of your heart. It provides your doctor with information about the size and shape of your heart and how well your heart's chambers and valves are working. This procedure takes approximately one hour. There are no restrictions for this procedure.  Your physician recommends that you schedule a follow-up appointment in: 1 MONTH EITHER 7/29 OR 7/30 DBL BOOK/ JB

## 2013-01-25 ENCOUNTER — Ambulatory Visit: Payer: 59 | Admitting: Cardiovascular Disease

## 2013-01-27 ENCOUNTER — Ambulatory Visit (HOSPITAL_COMMUNITY): Payer: 59 | Attending: Internal Medicine | Admitting: Radiology

## 2013-01-27 DIAGNOSIS — R0600 Dyspnea, unspecified: Secondary | ICD-10-CM

## 2013-01-27 DIAGNOSIS — R55 Syncope and collapse: Secondary | ICD-10-CM | POA: Insufficient documentation

## 2013-01-27 DIAGNOSIS — R000 Tachycardia, unspecified: Secondary | ICD-10-CM

## 2013-01-27 DIAGNOSIS — R0602 Shortness of breath: Secondary | ICD-10-CM

## 2013-01-27 DIAGNOSIS — O9989 Other specified diseases and conditions complicating pregnancy, childbirth and the puerperium: Secondary | ICD-10-CM | POA: Insufficient documentation

## 2013-01-27 DIAGNOSIS — I498 Other specified cardiac arrhythmias: Secondary | ICD-10-CM | POA: Insufficient documentation

## 2013-01-27 NOTE — Progress Notes (Signed)
Echocardiogram performed.  

## 2013-02-22 ENCOUNTER — Ambulatory Visit (INDEPENDENT_AMBULATORY_CARE_PROVIDER_SITE_OTHER): Payer: 59 | Admitting: Cardiovascular Disease

## 2013-02-22 ENCOUNTER — Encounter: Payer: Self-pay | Admitting: Cardiovascular Disease

## 2013-02-22 VITALS — BP 98/70 | HR 81 | Ht 60.0 in | Wt 158.8 lb

## 2013-02-22 DIAGNOSIS — R000 Tachycardia, unspecified: Secondary | ICD-10-CM

## 2013-02-22 DIAGNOSIS — I498 Other specified cardiac arrhythmias: Secondary | ICD-10-CM

## 2013-02-22 NOTE — Patient Instructions (Addendum)
Your physician wants you to follow-up in: 6 MONTHS.  You will receive a reminder letter in the mail two months in advance. If you don't receive a letter, please call our office to schedule the follow-up appointment.  Your physician recommends that you continue on your current medications as directed. Please refer to the Current Medication list given to you today.  

## 2013-02-22 NOTE — Assessment & Plan Note (Signed)
Keyerra is doing very well. She is tolerating metoprolol quite well. She's now [redacted] weeks pregnant and has not had any complications.  She would like to continue with the metoprolol through her pregnancy. She is starting to have some migraine headaches again and would like to get started back on the [place]  after she delivers. I told her this would be fine with me but that she should check with her OB doctor since she is planning on nursing.  I'll see her again in 6 months.

## 2013-02-22 NOTE — Progress Notes (Signed)
Susan Hines Date of Birth  November 09, 1990       [facility] Office [address]  [address] [place], [place]  [address]   [place], [place]  [address] [phone]     [phone]   Fax  [phone]    Fax [phone]  Problem List: 1. Tachycardia 2. Pregnancy 3. Pre-syncope 4. Migraine headaches  History of Present Illness:  Susan Hines is a 22 yo who was worked in today for further eval of tachycardia.  She has been on Nadalol or other beta blockers  for  15  for her migraines.  She also has been "jittery".  She also has had some orthostatic hypotension.   Her diet is OK.   May 1 ,2014:  We started her on Labetalol during her last visit.  She is now [redacted] weeks pregnant.  She still have episode of lightheadedness.  She thinks its due to her HR of 90-100.   She does have some dyspnea.   January 21, 2013:  She still has palpitations.    February 22, 2013:  Susan Hines is now [redacted] weeks pregnant. She is able to do all of her normal activities.   Current Outpatient Prescriptions on File Prior to Visit  Medication Sig Dispense Refill  . acetaminophen (TYLENOL) 500 MG tablet Take 1,000 mg by mouth every 6 (six) hours as needed for pain.      . metoprolol tartrate (LOPRESSOR) 25 MG tablet Take 1 tablet (25 mg total) by mouth 2 (two) times daily.  180 tablet  3  . omeprazole (PRILOSEC) 20 MG capsule Take 20 mg by mouth every morning.      . Prenatal Vit-Fe Fumarate-FA (PRENATAL MULTIVITAMIN) TABS Take 1 tablet by mouth daily.        No current facility-administered medications on file prior to visit.    Allergies  Allergen Reactions  . Augmentin (Amoxicillin-Pot Clavulanate) Diarrhea and Itching    Past Medical History  Diagnosis Date  . Headache(784.0)   . Infection   . Yeast cystitis   . Bacterial vaginosis   . Migraines   . Depression   . Paroxysmal tachycardia     Past Surgical History  Procedure Laterality Date  . Wisdom  tooth extraction      History  Smoking status  . Never Smoker   Smokeless tobacco  . Never Used    History  Alcohol Use No    Family History  Problem Relation Age of Onset  . Heart disease Maternal Grandmother   . Heart disease Maternal Grandfather   . Cancer Paternal Grandmother     Reviw of Systems:  Reviewed in the HPI.  All other systems are negative.  Physical Exam: Blood pressure 98/70, pulse 81, height 5' (1.524 m), weight 158 lb 12.8 oz (72.031 kg), last menstrual period 09/15/2012, SpO2 99.00%. General: Well developed, well nourished, in no acute distress.  Head: Normocephalic, atraumatic, sclera non-icteric, mucus membranes are moist,   Neck: Supple. Carotids are 2 + without bruits. No JVD   Lungs: Clear   Heart: RR, tachycardic, normal S1, S2  Abdomen: Soft, non-tender, non-distended with normal bowel sounds.  Msk:  Strength and tone are normal   Extremities: No clubbing or cyanosis. No edema.  Distal pedal pulses are 2+ and equal    Neuro: CN II - XII intact.  Alert and oriented X 3.   Psych:  Normal   ECG: October 25, 2012:  Sinus tachycardia   Assessment / Plan:

## 2013-03-05 ENCOUNTER — Inpatient Hospital Stay (HOSPITAL_COMMUNITY)
Admission: AD | Admit: 2013-03-05 | Discharge: 2013-03-05 | Disposition: A | Discharge status: Home / Self Care | Payer: 59 | Source: Ambulatory Visit | Attending: Obstetrics and Gynecology | Admitting: Obstetrics and Gynecology

## 2013-03-05 ENCOUNTER — Encounter (HOSPITAL_COMMUNITY): Payer: Self-pay | Admitting: *Deleted

## 2013-03-05 DIAGNOSIS — O9989 Other specified diseases and conditions complicating pregnancy, childbirth and the puerperium: Secondary | ICD-10-CM

## 2013-03-05 DIAGNOSIS — O368190 Decreased fetal movements, unspecified trimester, not applicable or unspecified: Secondary | ICD-10-CM | POA: Insufficient documentation

## 2013-03-05 DIAGNOSIS — O26892 Other specified pregnancy related conditions, second trimester: Secondary | ICD-10-CM

## 2013-03-05 DIAGNOSIS — M549 Dorsalgia, unspecified: Secondary | ICD-10-CM | POA: Insufficient documentation

## 2013-03-05 DIAGNOSIS — R109 Unspecified abdominal pain: Secondary | ICD-10-CM | POA: Insufficient documentation

## 2013-03-05 DIAGNOSIS — O368120 Decreased fetal movements, second trimester, not applicable or unspecified: Secondary | ICD-10-CM

## 2013-03-05 LAB — URINALYSIS, ROUTINE W REFLEX MICROSCOPIC
Bilirubin Urine: NEGATIVE
Leukocytes, UA: NEGATIVE
Nitrite: NEGATIVE
Specific Gravity, Urine: 1.015 (ref 1.005–1.030)
Urobilinogen, UA: 0.2 mg/dL (ref 0.0–1.0)
pH: 6 (ref 5.0–8.0)

## 2013-03-05 MED ORDER — ONDANSETRON 8 MG PO TBDP
8.0000 mg | ORAL_TABLET | Freq: Once | ORAL | Status: AC
  Administered 2013-03-05: 8 mg via ORAL
  Filled 2013-03-05: qty 1

## 2013-03-05 MED ORDER — OXYCODONE-ACETAMINOPHEN 5-325 MG PO TABS
1.0000 | ORAL_TABLET | Freq: Once | ORAL | Status: AC
  Administered 2013-03-05: 1 via ORAL
  Filled 2013-03-05: qty 1

## 2013-03-05 NOTE — MAU Note (Signed)
Pt presents with complaints of abdominal pain that is constant and it started around 10 this morning. She also states that she hasn't felt the baby move as much today.Denies any bleeding and LOF states normal discharge.

## 2013-03-05 NOTE — MAU Provider Note (Signed)
History     CSN: [id number]  Arrival date and time: 03/05/13 4098   First Provider Initiated Contact with Patient 03/05/13 1940      Chief Complaint  Patient presents with  . Abdominal Cramping   HPI Susan Hines 22 y.o. [redacted]w[redacted]d   Comes to MAU today with decreased fetal movement and lower abdominal cramping with back pain.  Has known low lying cervix covering part of the cervical os.  Denies any bleeding or leaking.  Has been on the monitor in MAU and no contractions seen on the monitor.  Has been feeling the baby move since she was here in MAU.  OB History   Grav Para Term Preterm Abortions TAB SAB Ect Mult Living   1               Past Medical History  Diagnosis Date  . Headache(784.0)   . Infection   . Yeast cystitis   . Bacterial vaginosis   . Migraines   . Depression   . Paroxysmal tachycardia     Past Surgical History  Procedure Laterality Date  . Wisdom tooth extraction    . Laparoscopic abdominal exploration      Family History  Problem Relation Age of Onset  . Heart disease Maternal Grandmother   . Heart disease Maternal Grandfather   . Cancer Paternal Grandmother     History  Substance Use Topics  . Smoking status: Never Smoker   . Smokeless tobacco: Never Used  . Alcohol Use: No    Allergies:  Allergies  Allergen Reactions  . Augmentin (Amoxicillin-Pot Clavulanate) Diarrhea and Itching    Prescriptions prior to admission  Medication Sig Dispense Refill  . acetaminophen (TYLENOL) 500 MG tablet Take 1,000 mg by mouth every 6 (six) hours as needed for pain.      . metoprolol tartrate (LOPRESSOR) 25 MG tablet Take 1 tablet (25 mg total) by mouth 2 (two) times daily.  180 tablet  3  . omeprazole (PRILOSEC) 20 MG capsule Take 20 mg by mouth every morning.      . Prenatal Vit-Fe Fumarate-FA (PRENATAL MULTIVITAMIN) TABS Take 1 tablet by mouth daily.         Review of Systems  Constitutional: Negative for fever.  Gastrointestinal: Negative for  nausea, vomiting, abdominal pain, diarrhea and constipation.  Genitourinary:       No vaginal discharge. No vaginal bleeding. No dysuria.  Musculoskeletal: Positive for back pain.   Physical Exam   Blood pressure 110/68, pulse 111, temperature 97.9 F (36.6 C), resp. rate 16, height 5' (1.524 m), weight 161 lb (73.029 kg), last menstrual period 09/15/2012.  Physical Exam  Nursing note and vitals reviewed. Constitutional: She is oriented to person, place, and time. She appears well-developed and well-nourished. No distress.  HENT:  Head: Normocephalic.  Eyes: EOM are normal.  Neck: Neck supple.  GI: Soft. There is no tenderness. There is no rebound and no guarding.  FHT stable on monitor.  NO contractions seen on strip or palpated by the examiner. Client is feeling the baby move.  Musculoskeletal: Normal range of motion.  Low back pain all across low back  Neurological: She is alert and oriented to person, place, and time.  Skin: Skin is warm and dry.  Psychiatric: She has a normal mood and affect.    MAU Course  Procedures Results for orders placed during the hospital encounter of 03/05/13 (from the past 24 hour(s))  URINALYSIS, ROUTINE W REFLEX MICROSCOPIC  Status: None   Collection Time    03/05/13  5:45 PM      Result Value Range   Color, Urine YELLOW  YELLOW   APPearance CLEAR  CLEAR   Specific Gravity, Urine 1.015  1.005 - 1.030   pH 6.0  5.0 - 8.0   Glucose, UA NEGATIVE  NEGATIVE mg/dL   Hgb urine dipstick NEGATIVE  NEGATIVE   Bilirubin Urine NEGATIVE  NEGATIVE   Ketones, ur NEGATIVE  NEGATIVE mg/dL   Protein, ur NEGATIVE  NEGATIVE mg/dL   Urobilinogen, UA 0.2  0.0 - 1.0 mg/dL   Nitrite NEGATIVE  NEGATIVE   Leukocytes, UA NEGATIVE  NEGATIVE    MDM Consult with Dr. Senaida Ores re: plan of care  Assessment and Plan  Back pain Decreased fetal movement  Plan Will give one dose of Percocet and zofran for nausea (caused by narcotic usually) and  discharge Keep appointment in the office on Tuesday. Call the doctor if your back pain worsens. Return if you have any vaginal bleeding.  Delorise Hunkele 03/05/2013, 7:58 PM

## 2013-04-22 ENCOUNTER — Inpatient Hospital Stay (HOSPITAL_COMMUNITY)
Admission: AD | Admit: 2013-04-22 | Discharge: 2013-04-23 | Disposition: A | Discharge status: Home / Self Care | Payer: 59 | Source: Ambulatory Visit | Attending: Obstetrics and Gynecology | Admitting: Obstetrics and Gynecology

## 2013-04-22 ENCOUNTER — Encounter (HOSPITAL_COMMUNITY): Payer: Self-pay | Admitting: *Deleted

## 2013-04-22 DIAGNOSIS — R109 Unspecified abdominal pain: Secondary | ICD-10-CM | POA: Insufficient documentation

## 2013-04-22 DIAGNOSIS — R0989 Other specified symptoms and signs involving the circulatory and respiratory systems: Secondary | ICD-10-CM

## 2013-04-22 DIAGNOSIS — I10 Essential (primary) hypertension: Secondary | ICD-10-CM

## 2013-04-22 DIAGNOSIS — O99891 Other specified diseases and conditions complicating pregnancy: Secondary | ICD-10-CM | POA: Insufficient documentation

## 2013-04-22 DIAGNOSIS — R030 Elevated blood-pressure reading, without diagnosis of hypertension: Secondary | ICD-10-CM | POA: Insufficient documentation

## 2013-04-22 LAB — COMPREHENSIVE METABOLIC PANEL
ALT: 8 U/L (ref 0–35)
AST: 11 U/L (ref 0–37)
Albumin: 2.7 g/dL — ABNORMAL LOW (ref 3.5–5.2)
BUN: 6 mg/dL (ref 6–23)
Calcium: 9.7 mg/dL (ref 8.4–10.5)
Chloride: 103 mEq/L (ref 96–112)
Creatinine, Ser: 0.59 mg/dL (ref 0.50–1.10)
GFR calc non Af Amer: >90 mL/min (ref >90)
Total Bilirubin: 0.2 mg/dL — ABNORMAL LOW (ref 0.3–1.2)
Total Protein: 6.4 g/dL (ref 6.0–8.3)

## 2013-04-22 LAB — LACTATE DEHYDROGENASE: LDH: 152 U/L (ref 94–250)

## 2013-04-22 LAB — CBC
HCT: 33.3 % — ABNORMAL LOW (ref 36.0–46.0)
MCH: 28.6 pg (ref 26.0–34.0)
MCHC: 33 g/dL (ref 30.0–36.0)
MCV: 86.5 fL (ref 78.0–100.0)
Platelets: 302 10*3/uL (ref 150–400)
RDW: 13.2 % (ref 11.5–15.5)
WBC: 11.5 10*3/uL — ABNORMAL HIGH (ref 4.0–10.5)

## 2013-04-22 LAB — URINALYSIS, ROUTINE W REFLEX MICROSCOPIC
Hgb urine dipstick: NEGATIVE
Nitrite: NEGATIVE
Protein, ur: NEGATIVE mg/dL
Urobilinogen, UA: 0.2 mg/dL (ref 0.0–1.0)

## 2013-04-22 LAB — URIC ACID: Uric Acid, Serum: 3.4 mg/dL (ref 2.4–7.0)

## 2013-04-22 NOTE — MAU Provider Note (Signed)
History     CSN: [id number]  Arrival date and time: 04/22/13 2135   None     Chief Complaint  Patient presents with  . Hypertension  . Abdominal Pain   HPI  Ms. Susan Hines is a 22 y.o. female; G1P0 at [redacted]w[redacted]d who presents following two elevated BP readings at home. She took her blood pressure herself using a manual blood pressure cuff and stethoscope. The last reading she got at home  was 140/100. She called the office and they told her to come into MAU for evaluation. She reports good fetal movement, denies LOF, vaginal bleeding, vaginal itching/burning, urinary symptoms, h/a, dizziness, n/v, or fever/chills.    OB History   Grav Para Term Preterm Abortions TAB SAB Ect Mult Living   1               Past Medical History  Diagnosis Date  . Headache(784.0)   . Infection   . Yeast cystitis   . Bacterial vaginosis   . Migraines   . Depression   . Paroxysmal tachycardia     Past Surgical History  Procedure Laterality Date  . Wisdom tooth extraction    . Laparoscopic abdominal exploration      Family History  Problem Relation Age of Onset  . Heart disease Maternal Grandmother   . Heart disease Maternal Grandfather   . Cancer Paternal Grandmother     History  Substance Use Topics  . Smoking status: Never Smoker   . Smokeless tobacco: Never Used  . Alcohol Use: No    Allergies:  Allergies  Allergen Reactions  . Augmentin [Amoxicillin-Pot Clavulanate] Diarrhea and Itching    Prescriptions prior to admission  Medication Sig Dispense Refill  . acetaminophen (TYLENOL) 500 MG tablet Take 1,000 mg by mouth every 6 (six) hours as needed for pain.      . metoprolol tartrate (LOPRESSOR) 25 MG tablet Take 1 tablet (25 mg total) by mouth 2 (two) times daily.  180 tablet  3  . omeprazole (PRILOSEC) 20 MG capsule Take 20 mg by mouth every morning.      . Prenatal Vit-Fe Fumarate-FA (PRENATAL MULTIVITAMIN) TABS Take 1 tablet by mouth daily.        Review of Systems   Constitutional: Negative for fever and chills.  HENT:       History of migraines.   Gastrointestinal: Positive for abdominal pain. Negative for nausea, vomiting, diarrhea and constipation.       Right upper and lower abdominal pain   Genitourinary: Negative for dysuria, urgency, frequency and hematuria.  Neurological: Positive for headaches. Negative for dizziness.   Physical Exam   Blood pressure 105/72, pulse 100, temperature 98 F (36.7 C), resp. rate 20, height 5' (1.524 m), weight 74.753 kg (164 lb 12.8 oz), last menstrual period 09/15/2012.  Physical Exam  Constitutional: She is oriented to person, place, and time. She appears well-developed and well-nourished. No distress.  Eyes: Pupils are equal, round, and reactive to light.  Respiratory: Effort normal.  GI: Soft. She exhibits no distension. There is no tenderness. There is no rebound and no guarding.  Musculoskeletal:       Right ankle: She exhibits no swelling.       Left ankle: She exhibits no swelling.  Neurological: She is alert and oriented to person, place, and time. She has normal strength and normal reflexes.  Negative clonus   Skin: Skin is warm and dry. She is not diaphoretic.   Fetal Tracing: Baseline:  140 bpm  Variability: + Accelerations: 15X15 Decelerations: none  Toco: None  MAU Course  Procedures None  MDM CBC CMET Uric acid LDH  UA  NST  Dr. Ellyn Hack reviewed BP readings; ok to send patient home if labs come back normal.  Report given to M. Mayford Knife CNM who resumes care of the patient  Assessment and Plan   Debbrah Alar FNP-C 04/22/2013, 11:24 PM   Results reviewed: Results for orders placed during the hospital encounter of 04/22/13 (from the past 72 hour(s))  URINALYSIS, ROUTINE W REFLEX MICROSCOPIC     Status: Abnormal   Collection Time    04/22/13 10:00 PM      Result Value Range   Color, Urine YELLOW  YELLOW   APPearance CLEAR  CLEAR   Specific Gravity, Urine >1.030  (*) 1.005 - 1.030   pH 6.0  5.0 - 8.0   Glucose, UA NEGATIVE  NEGATIVE mg/dL   Hgb urine dipstick NEGATIVE  NEGATIVE   Bilirubin Urine NEGATIVE  NEGATIVE   Ketones, ur 15 (*) NEGATIVE mg/dL   Protein, ur NEGATIVE  NEGATIVE mg/dL   Urobilinogen, UA 0.2  0.0 - 1.0 mg/dL   Nitrite NEGATIVE  NEGATIVE   Leukocytes, UA NEGATIVE  NEGATIVE   Comment: MICROSCOPIC NOT DONE ON URINES WITH NEGATIVE PROTEIN, BLOOD, LEUKOCYTES, NITRITE, OR GLUCOSE <1000 mg/dL.  CBC     Status: Abnormal   Collection Time    04/22/13 11:03 PM      Result Value Range   WBC 11.5 (*) 4.0 - 10.5 K/uL   RBC 3.85 (*) 3.87 - 5.11 MIL/uL   Hemoglobin 11.0 (*) 12.0 - 15.0 g/dL   HCT 16.1 (*) 09.6 - 04.5 %   MCV 86.5  78.0 - 100.0 fL   MCH 28.6  26.0 - 34.0 pg   MCHC 33.0  30.0 - 36.0 g/dL   RDW 40.9  81.1 - 91.4 %   Platelets 302  150 - 400 K/uL  COMPREHENSIVE METABOLIC PANEL     Status: Abnormal   Collection Time    04/22/13 11:03 PM      Result Value Range   Sodium 137  135 - 145 mEq/L   Potassium 3.6  3.5 - 5.1 mEq/L   Chloride 103  96 - 112 mEq/L   CO2 22  19 - 32 mEq/L   Glucose, Bld 95  70 - 99 mg/dL   BUN 6  6 - 23 mg/dL   Creatinine, Ser 7.82  0.50 - 1.10 mg/dL   Calcium 9.7  8.4 - 95.6 mg/dL   Total Protein 6.4  6.0 - 8.3 g/dL   Albumin 2.7 (*) 3.5 - 5.2 g/dL   AST 11  0 - 37 U/L   ALT 8  0 - 35 U/L   Alkaline Phosphatase 67  39 - 117 U/L   Total Bilirubin 0.2 (*) 0.3 - 1.2 mg/dL   GFR calc non Af Amer >90  >90 mL/min   GFR calc Af Amer >90  >90 mL/min   Comment: (NOTE)     The eGFR has been calculated using the CKD EPI equation.     This calculation has not been validated in all clinical situations.     eGFR's persistently <90 mL/min signify possible Chronic Kidney     Disease.  URIC ACID     Status: None   Collection Time    04/22/13 11:03 PM      Result Value Range   Uric Acid, Serum  3.4  2.4 - 7.0 mg/dL  LACTATE DEHYDROGENASE     Status: None   Collection Time    04/22/13 11:03 PM       Result Value Range   LDH 152  94 - 250 U/L   Discussed results with patient.  Discharge home Followup in office PIH precautions.

## 2013-04-22 NOTE — MAU Note (Signed)
I took my B/P this morning because I thought my face was swollen and 130/90 at 1030. Took B/P again at 2030 and was 142/100. Started having some pain R side of abd on way to hospital

## 2013-06-02 ENCOUNTER — Other Ambulatory Visit: Payer: Self-pay

## 2013-06-14 ENCOUNTER — Telehealth (HOSPITAL_COMMUNITY): Payer: Self-pay | Admitting: *Deleted

## 2013-06-14 ENCOUNTER — Encounter (HOSPITAL_COMMUNITY): Payer: Self-pay | Admitting: *Deleted

## 2013-06-14 LAB — OB RESULTS CONSOLE GBS: GBS: NEGATIVE

## 2013-06-14 NOTE — Telephone Encounter (Signed)
Preadmission screen  

## 2013-06-16 ENCOUNTER — Encounter (HOSPITAL_COMMUNITY): Payer: Self-pay

## 2013-06-16 ENCOUNTER — Inpatient Hospital Stay (HOSPITAL_COMMUNITY)
Admission: RE | Admit: 2013-06-16 | Discharge: 2013-06-20 | DRG: 775 | Disposition: A | Discharge status: Home / Self Care | Payer: 59 | Source: Ambulatory Visit | Attending: Obstetrics and Gynecology | Admitting: Obstetrics and Gynecology

## 2013-06-16 DIAGNOSIS — IMO0002 Reserved for concepts with insufficient information to code with codable children: Secondary | ICD-10-CM

## 2013-06-16 DIAGNOSIS — O365990 Maternal care for other known or suspected poor fetal growth, unspecified trimester, not applicable or unspecified: Principal | ICD-10-CM | POA: Diagnosis present

## 2013-06-16 DIAGNOSIS — O99892 Other specified diseases and conditions complicating childbirth: Secondary | ICD-10-CM | POA: Diagnosis present

## 2013-06-16 DIAGNOSIS — R000 Tachycardia, unspecified: Secondary | ICD-10-CM | POA: Diagnosis present

## 2013-06-16 HISTORY — DX: Reserved for concepts with insufficient information to code with codable children: IMO0002

## 2013-06-16 LAB — CBC
HCT: 31.9 % — ABNORMAL LOW (ref 36.0–46.0)
MCH: 26.4 pg (ref 26.0–34.0)
MCHC: 32.3 g/dL (ref 30.0–36.0)
MCV: 81.8 fL (ref 78.0–100.0)
Platelets: 366 10*3/uL (ref 150–400)
RDW: 14.3 % (ref 11.5–15.5)
WBC: 14.5 10*3/uL — ABNORMAL HIGH (ref 4.0–10.5)

## 2013-06-16 MED ORDER — DIPHENHYDRAMINE HCL 50 MG/ML IJ SOLN: 12.5000 mg | INTRAMUSCULAR | Status: DC | PRN

## 2013-06-16 MED ORDER — IBUPROFEN 600 MG PO TABS
600.0000 mg | ORAL_TABLET | Freq: Four times a day (QID) | ORAL | Status: DC | PRN
  Administered 2013-06-18: 600 mg via ORAL
  Filled 2013-06-16: qty 1

## 2013-06-16 MED ORDER — OXYTOCIN 40 UNITS IN LACTATED RINGERS INFUSION - SIMPLE MED: 62.5000 mL/h | INTRAVENOUS | Status: DC

## 2013-06-16 MED ORDER — PHENYLEPHRINE 40 MCG/ML (10ML) SYRINGE FOR IV PUSH (FOR BLOOD PRESSURE SUPPORT)
80.0000 ug | PREFILLED_SYRINGE | INTRAVENOUS | Status: DC | PRN
  Filled 2013-06-16 (×2): qty 10
  Filled 2013-06-16: qty 2

## 2013-06-16 MED ORDER — METOPROLOL TARTRATE 25 MG PO TABS
25.0000 mg | ORAL_TABLET | Freq: Two times a day (BID) | ORAL | Status: DC
  Administered 2013-06-16 – 2013-06-17 (×3): 25 mg via ORAL
  Filled 2013-06-16 (×4): qty 1

## 2013-06-16 MED ORDER — OXYTOCIN BOLUS FROM INFUSION
500.0000 mL | INTRAVENOUS | Status: DC
  Administered 2013-06-18: 500 mL via INTRAVENOUS

## 2013-06-16 MED ORDER — ONDANSETRON HCL 4 MG/2ML IJ SOLN
4.0000 mg | Freq: Four times a day (QID) | INTRAMUSCULAR | Status: DC | PRN
  Administered 2013-06-17 (×2): 4 mg via INTRAVENOUS
  Filled 2013-06-16 (×2): qty 2

## 2013-06-16 MED ORDER — FENTANYL 2.5 MCG/ML BUPIVACAINE 1/10 % EPIDURAL INFUSION (WH - ANES)
14.0000 mL/h | INTRAMUSCULAR | Status: DC | PRN
  Administered 2013-06-17 (×3): 14 mL/h via EPIDURAL
  Filled 2013-06-16 (×3): qty 125

## 2013-06-16 MED ORDER — BUTORPHANOL TARTRATE 1 MG/ML IJ SOLN
1.0000 mg | INTRAMUSCULAR | Status: DC | PRN
  Administered 2013-06-17: 2 mg via INTRAVENOUS
  Filled 2013-06-16 (×2): qty 2

## 2013-06-16 MED ORDER — PHENYLEPHRINE 40 MCG/ML (10ML) SYRINGE FOR IV PUSH (FOR BLOOD PRESSURE SUPPORT)
80.0000 ug | PREFILLED_SYRINGE | INTRAVENOUS | Status: DC | PRN
  Filled 2013-06-16: qty 2

## 2013-06-16 MED ORDER — LACTATED RINGERS IV SOLN: 500.0000 mL | Freq: Once | INTRAVENOUS | Status: DC

## 2013-06-16 MED ORDER — LACTATED RINGERS IV SOLN
INTRAVENOUS | Status: DC
  Administered 2013-06-16: 21:00:00 via INTRAVENOUS
  Administered 2013-06-17: 1000 mL via INTRAVENOUS
  Administered 2013-06-17: 01:00:00 via INTRAVENOUS
  Administered 2013-06-17: 1000 mL via INTRAVENOUS
  Administered 2013-06-18: 01:00:00 via INTRAVENOUS

## 2013-06-16 MED ORDER — EPHEDRINE 5 MG/ML INJ
10.0000 mg | INTRAVENOUS | Status: DC | PRN
  Filled 2013-06-16: qty 4
  Filled 2013-06-16: qty 2
  Filled 2013-06-16: qty 4

## 2013-06-16 MED ORDER — BUTORPHANOL TARTRATE 1 MG/ML IJ SOLN: 1.0000 mg | INTRAMUSCULAR | Status: DC | PRN

## 2013-06-16 MED ORDER — OXYCODONE-ACETAMINOPHEN 5-325 MG PO TABS: 1.0000 | ORAL_TABLET | ORAL | Status: DC | PRN

## 2013-06-16 MED ORDER — ACETAMINOPHEN 325 MG PO TABS
650.0000 mg | ORAL_TABLET | ORAL | Status: DC | PRN
  Administered 2013-06-18: 650 mg via ORAL
  Filled 2013-06-16: qty 2

## 2013-06-16 MED ORDER — EPHEDRINE 5 MG/ML INJ
10.0000 mg | INTRAVENOUS | Status: DC | PRN
  Filled 2013-06-16: qty 2

## 2013-06-16 MED ORDER — METOPROLOL TARTRATE 50 MG PO TABS
50.0000 mg | ORAL_TABLET | Freq: Two times a day (BID) | ORAL | Status: DC
  Filled 2013-06-16 (×2): qty 1

## 2013-06-16 MED ORDER — OXYTOCIN 40 UNITS IN LACTATED RINGERS INFUSION - SIMPLE MED
1.0000 m[IU]/min | INTRAVENOUS | Status: DC
  Administered 2013-06-17: 2 m[IU]/min via INTRAVENOUS
  Filled 2013-06-16: qty 1000

## 2013-06-16 MED ORDER — PANTOPRAZOLE SODIUM 40 MG PO TBEC
40.0000 mg | DELAYED_RELEASE_TABLET | Freq: Every day | ORAL | Status: DC
  Administered 2013-06-17: 40 mg via ORAL
  Filled 2013-06-16 (×2): qty 1

## 2013-06-16 MED ORDER — LIDOCAINE HCL (PF) 1 % IJ SOLN
30.0000 mL | INTRAMUSCULAR | Status: DC | PRN
  Filled 2013-06-16 (×2): qty 30

## 2013-06-16 MED ORDER — MISOPROSTOL 25 MCG QUARTER TABLET
25.0000 ug | ORAL_TABLET | ORAL | Status: DC | PRN
  Administered 2013-06-16 – 2013-06-17 (×2): 25 ug via VAGINAL
  Filled 2013-06-16: qty 0.25
  Filled 2013-06-16: qty 1
  Filled 2013-06-16: qty 0.25

## 2013-06-16 MED ORDER — CITRIC ACID-SODIUM CITRATE 334-500 MG/5ML PO SOLN: 30.0000 mL | ORAL | Status: DC | PRN

## 2013-06-16 MED ORDER — LACTATED RINGERS IV SOLN
500.0000 mL | INTRAVENOUS | Status: DC | PRN
  Administered 2013-06-17: 700 mL via INTRAVENOUS

## 2013-06-16 MED ORDER — TERBUTALINE SULFATE 1 MG/ML IJ SOLN: 0.2500 mg | Freq: Once | INTRAMUSCULAR | Status: AC | PRN

## 2013-06-16 NOTE — H&P (Signed)
Susan Hines is a 22 y.o. female G1P0 at 39+ for IOL given IUGR.  Pt has h/o tachycardia, treated initially with Nadolod and later changed to labetalol.  Treated by cardiologist, Nasher.  D/w MFM growth scans are followed, Pt with decreased growth, nl AFI and dopplers.  +FM, no LOF, no VB, occ ctx.  Pregnancy dated by LMP cw First Tri ultrasound.  Has recurrent/inadequately treated BV.   Maternal Medical History:  Contractions: Frequency: irregular.    Fetal activity: Perceived fetal activity is normal.    Prenatal complications: IUGR.   Prenatal Complications - Diabetes: none.    OB History   Grav Para Term Preterm Abortions TAB SAB Ect Mult Living   1             G1 present; no abn pap, no STDs  Past Medical History  Diagnosis Date  . Headache(784.0)   . Infection   . Yeast cystitis   . Bacterial vaginosis   . Migraines   . Paroxysmal tachycardia   . Hx of varicella   . Anxiety   . IUGR (intrauterine growth restriction) 06/16/2013   Past Surgical History  Procedure Laterality Date  . Wisdom tooth extraction    . Laparoscopic abdominal exploration     Family History: family history includes Cancer in her paternal grandmother; Heart disease in her maternal grandfather and maternal grandmother. Social History:  reports that she has never smoked. She has never used smokeless tobacco. She reports that she does not drink alcohol or use illicit drugs. Meds: fluoxetine, (nadolol), toprol, prilosec, pnv All: Augmentin   Prenatal Transfer Tool  Maternal Diabetes: No Genetic Screening: Normal Maternal Ultrasounds/Referrals: Abnormal:  Findings:   IUGR Fetal Ultrasounds or other Referrals:  None Maternal Substance Abuse:  No Significant Maternal Medications:  Meds include: Other: Initially Nadolol, now labetalol Significant Maternal Lab Results:  Lab values include: Group B Strep negative Other Comments:  growth followed serially, nl dopplers, R NST  Review of Systems   Constitutional: Negative.   HENT: Negative.   Eyes: Negative.   Respiratory: Negative.   Cardiovascular: Negative.   Gastrointestinal: Negative.   Genitourinary: Negative.   Musculoskeletal: Negative.   Skin: Negative.   Neurological: Negative.   Psychiatric/Behavioral: Negative.       Temperature 98.7 F (37.1 C), temperature source Oral, resp. rate 18, height 5' (1.524 m), weight 77.111 kg (170 lb), last menstrual period 09/15/2012. Maternal Exam:  Uterine Assessment: Contraction frequency is irregular.   Abdomen: Fundal height is S<D.   Estimated fetal weight is 6-7#.   Fetal presentation: vertex  Introitus: Normal vulva. Normal vagina.  Pelvis: adequate for delivery.   Cervix: Cervix evaluated by digital exam.     Physical Exam  Constitutional: She is oriented to person, place, and time. She appears well-developed and well-nourished.  HENT:  Head: Normocephalic and atraumatic.  Cardiovascular: Normal rate and regular rhythm.   Respiratory: Effort normal and breath sounds normal. No respiratory distress. She has no wheezes.  GI: Soft. Bowel sounds are normal. She exhibits no distension. There is no tenderness.  Musculoskeletal: Normal range of motion.  Neurological: She is alert and oriented to person, place, and time.  Skin: Skin is warm and dry.  Psychiatric: She has a normal mood and affect. Her behavior is normal.    Prenatal labs: ABO, Rh: A/Positive/-- (04/30 0000) Antibody: Negative (04/30 0000) Rubella: Immune (04/30 0000) RPR: Nonreactive (04/30 0000)  HBsAg: Negative (04/30 0000)  HIV: Non-reactive (04/30 0000)  GBS: Negative (  11/18 0000)  Hgb 12.5/Pap WNL/ Ur Cx neg/ Chl neg/ GC neg/ CF neg/ glucola 84/Plt 358K/  First Tri Scr and AFP WNL Nl NT, anterior previa Nl anat, nl AFI Growth at 14.7%, nl anat, ant plac Growth at 25% nl AFI, vtx, nl anat Growth at 11% nl AFI, nl dopplera, 8/8 BPP/vtx, ant plac  Assessment/Plan: 21yo G1P0 at 39+ with  IUGR for IOL cytotec O/N for ripening, then AROM/pitocin GBBS neg - no prophylaxis Expect SVD Epidural/IV pain meds prn   BOVARD,Kieley Akter 06/16/2013, 8:47 PM

## 2013-06-17 ENCOUNTER — Inpatient Hospital Stay (HOSPITAL_COMMUNITY): Payer: 59 | Admitting: Anesthesiology

## 2013-06-17 ENCOUNTER — Encounter (HOSPITAL_COMMUNITY): Payer: 59 | Admitting: Anesthesiology

## 2013-06-17 MED ORDER — BUPIVACAINE HCL (PF) 0.25 % IJ SOLN
INTRAMUSCULAR | Status: DC | PRN
  Administered 2013-06-17: 3 mL via EPIDURAL
  Administered 2013-06-17: 4 mL via EPIDURAL

## 2013-06-17 MED ORDER — SODIUM BICARBONATE 8.4 % IV SOLN
INTRAVENOUS | Status: DC | PRN
  Administered 2013-06-17: 5 mL via EPIDURAL

## 2013-06-17 MED ORDER — SODIUM CHLORIDE 0.9 % IV SOLN
2.0000 g | Freq: Four times a day (QID) | INTRAVENOUS | Status: DC
  Administered 2013-06-17: 2 g via INTRAVENOUS
  Filled 2013-06-17 (×4): qty 2000

## 2013-06-17 MED ORDER — PROMETHAZINE HCL 25 MG/ML IJ SOLN
12.5000 mg | Freq: Three times a day (TID) | INTRAMUSCULAR | Status: DC | PRN
  Administered 2013-06-17 – 2013-06-18 (×2): 12.5 mg via INTRAVENOUS
  Filled 2013-06-17 (×2): qty 1

## 2013-06-17 NOTE — Progress Notes (Addendum)
Patient ID: Susan Hines, female   DOB: [birth year], 22 y.o.   MRN: [id number]  Comfortable with epidural  AF (Tm = 100) VSS gen NAD  FHTs 150's category 1  toco Q 2-4 min  IUPC placed w/o diff/comp  SVE 8/90/0-+1  Adjust pitocin prn Plan for SVD Ampicillin for fever

## 2013-06-17 NOTE — Anesthesia Procedure Notes (Signed)

## 2013-06-17 NOTE — Anesthesia Preprocedure Evaluation (Signed)
Anesthesia Evaluation Anesthesia Physical Anesthesia Plan  ASA: II  Anesthesia Plan: Epidural   Post-op Pain Management:    Induction:   Airway Management Planned:   Additional Equipment:   Intra-op Plan:   Post-operative Plan:   Informed Consent: I have reviewed the patients History and Physical, chart, labs and discussed the procedure including the risks, benefits and alternatives for the proposed anesthesia with the patient or authorized representative who has indicated his/her understanding and acceptance.   Dental Advisory Given  Plan Discussed with:   Anesthesia Plan Comments: (Labs checked- platelets confirmed with RN in room. Fetal heart tracing, per RN, reported to be stable enough for sitting procedure. Discussed epidural, and patient consents to the procedure:  included risk of possible headache,backache, failed block, allergic reaction, and nerve injury. This patient was asked if she had any questions or concerns before the procedure started.)        Anesthesia Quick Evaluation  

## 2013-06-17 NOTE — Progress Notes (Signed)
Patient ID: Susan Hines, female   DOB: 1990-08-12, 22 y.o.   MRN: [id number]  Getting uncomfortable  AFVSS gen NAD FHTs 130's, category 1 toco Q 2 min  ROM for clear fluid at 6:50am Pitocin, Stadol, epidural prn Expect SVD

## 2013-06-18 ENCOUNTER — Encounter (HOSPITAL_COMMUNITY): Payer: Self-pay

## 2013-06-18 LAB — CBC
HCT: 24.7 % — ABNORMAL LOW (ref 36.0–46.0)
MCHC: 32.8 g/dL (ref 30.0–36.0)
MCV: 80.5 fL (ref 78.0–100.0)
Platelets: 288 10*3/uL (ref 150–400)
RBC: 3.07 MIL/uL — ABNORMAL LOW (ref 3.87–5.11)
RDW: 14.4 % (ref 11.5–15.5)
WBC: 22.8 10*3/uL — ABNORMAL HIGH (ref 4.0–10.5)

## 2013-06-18 MED ORDER — DIBUCAINE 1 % RE OINT: 1.0000 "application " | TOPICAL_OINTMENT | RECTAL | Status: DC | PRN

## 2013-06-18 MED ORDER — PRENATAL MULTIVITAMIN CH
1.0000 | ORAL_TABLET | Freq: Every day | ORAL | Status: DC
  Administered 2013-06-18 – 2013-06-20 (×3): 1 via ORAL
  Filled 2013-06-18 (×3): qty 1

## 2013-06-18 MED ORDER — LANOLIN HYDROUS EX OINT: TOPICAL_OINTMENT | CUTANEOUS | Status: DC | PRN

## 2013-06-18 MED ORDER — MISOPROSTOL 200 MCG PO TABS
800.0000 ug | ORAL_TABLET | Freq: Once | ORAL | Status: AC
  Administered 2013-06-18: 800 ug via RECTAL

## 2013-06-18 MED ORDER — PANTOPRAZOLE SODIUM 40 MG PO TBEC
40.0000 mg | DELAYED_RELEASE_TABLET | Freq: Every day | ORAL | Status: DC
  Administered 2013-06-18 – 2013-06-19 (×2): 40 mg via ORAL
  Filled 2013-06-18 (×4): qty 1

## 2013-06-18 MED ORDER — SIMETHICONE 80 MG PO CHEW: 80.0000 mg | CHEWABLE_TABLET | ORAL | Status: DC | PRN

## 2013-06-18 MED ORDER — LACTATED RINGERS IV SOLN: INTRAVENOUS | Status: DC

## 2013-06-18 MED ORDER — ZOLPIDEM TARTRATE 5 MG PO TABS: 5.0000 mg | ORAL_TABLET | Freq: Every evening | ORAL | Status: DC | PRN

## 2013-06-18 MED ORDER — OXYCODONE-ACETAMINOPHEN 5-325 MG PO TABS
1.0000 | ORAL_TABLET | ORAL | Status: DC | PRN
  Administered 2013-06-18 – 2013-06-19 (×3): 1 via ORAL
  Filled 2013-06-18 (×3): qty 1

## 2013-06-18 MED ORDER — WITCH HAZEL-GLYCERIN EX PADS: 1.0000 "application " | MEDICATED_PAD | CUTANEOUS | Status: DC | PRN

## 2013-06-18 MED ORDER — TETANUS-DIPHTH-ACELL PERTUSSIS 5-2.5-18.5 LF-MCG/0.5 IM SUSP: 0.5000 mL | Freq: Once | INTRAMUSCULAR | Status: DC

## 2013-06-18 MED ORDER — MISOPROSTOL 200 MCG PO TABS
ORAL_TABLET | ORAL | Status: AC
  Filled 2013-06-18: qty 4

## 2013-06-18 MED ORDER — ONDANSETRON HCL 4 MG PO TABS: 4.0000 mg | ORAL_TABLET | ORAL | Status: DC | PRN

## 2013-06-18 MED ORDER — DIPHENHYDRAMINE HCL 25 MG PO CAPS: 25.0000 mg | ORAL_CAPSULE | Freq: Four times a day (QID) | ORAL | Status: DC | PRN

## 2013-06-18 MED ORDER — BENZOCAINE-MENTHOL 20-0.5 % EX AERO
1.0000 "application " | INHALATION_SPRAY | CUTANEOUS | Status: DC | PRN
  Filled 2013-06-18: qty 56

## 2013-06-18 MED ORDER — IBUPROFEN 600 MG PO TABS
600.0000 mg | ORAL_TABLET | Freq: Four times a day (QID) | ORAL | Status: DC
  Administered 2013-06-18 – 2013-06-20 (×10): 600 mg via ORAL
  Filled 2013-06-18 (×10): qty 1

## 2013-06-18 MED ORDER — ONDANSETRON HCL 4 MG/2ML IJ SOLN: 4.0000 mg | INTRAMUSCULAR | Status: DC | PRN

## 2013-06-18 MED ORDER — SENNOSIDES-DOCUSATE SODIUM 8.6-50 MG PO TABS
2.0000 | ORAL_TABLET | ORAL | Status: DC
  Administered 2013-06-19 (×2): 2 via ORAL
  Filled 2013-06-18 (×2): qty 2

## 2013-06-18 NOTE — Anesthesia Postprocedure Evaluation (Signed)
Anesthesia Post Note  Patient: Susan Hines  Procedure(s) Performed: * No procedures listed *  Anesthesia type: Epidural  Patient location: Mother/Baby  Post pain: Pain level controlled  Post assessment: Post-op Vital signs reviewed  Last Vitals:  Filed Vitals:   06/18/13 0640  BP: 102/55  Pulse: 98  Temp: 36.9 C  Resp: 18    Post vital signs: Reviewed  Level of consciousness:alert  Complications: No apparent anesthesia complications

## 2013-06-18 NOTE — Progress Notes (Signed)
Post Partum Day 0 Subjective: no complaints, up ad lib, tolerating PO and nl lochia, pain controlled  Objective: Blood pressure 102/55, pulse 98, temperature 98.5 F (36.9 C), temperature source Oral, resp. rate 18, height 5' (1.524 m), weight 77.111 kg (170 lb), last menstrual period 09/15/2012, SpO2 100.00%, unknown if currently breastfeeding.  Physical Exam:  General: alert and no distress Lochia: appropriate Uterine Fundus: firm    Recent Labs  06/16/13 2030 06/18/13 0606  HGB 10.3* 8.1*  HCT 31.9* 24.7*    Assessment/Plan: Plan for discharge tomorrow or Monday.  Routine care.   LOS: 2 days   BOVARD,Kiela Shisler 06/18/2013, 10:30 AM

## 2013-06-18 NOTE — Progress Notes (Signed)
Patient ID: Susan Hines, female   DOB: Jul 22, 1991, 22 y.o.   MRN: [id number]  Pt comfortable with epidural, feeling more pressure in bottom  AFVSS gen NAD FHTs 140-150, mod var toco q 2-25min  SVE 10/100/+2-3, start pushing soon  Anticipate SVD

## 2013-06-18 NOTE — Progress Notes (Signed)
Patient ID: Susan Hines, female   DOB: [birth year], 22 y.o.   MRN: [id number]  comf with epidural, some pressure  AFVSS  gen NAD FHTs140's, category 1 toco q 2-3 min  SVE 9/100/0-+`  D/w pt POC, expect SVD - expect start pushing soon.

## 2013-06-18 NOTE — Progress Notes (Signed)
Attempted visit with  mother.  She had several visitors and this writer was asked to return at a later time. Claudia Alvizo J, LCSW

## 2013-06-18 NOTE — Lactation Note (Signed)
This note was copied from the chart of Susan Fayetta Sorenson. Lactation Consultation Note  Patient Name: Susan Hines [id number] Date: 06/18/2013 Reason for consult: Initial assessment;Difficult latch;Breast/nipple pain  Visited with Mom, baby 9 hrs old.  Mom has flat nipples and both have abrasions and reddened on tips.  Baby resting skin to skin, following bath.  Offered assistance with breast feeding as Mom is very concerned that baby had latched only on nipple causing pinching and bruising/reddness.  Baby woke up and we positioned baby into football hold.  Manual breast expression taught and encouraged.  Colostrum flowing easily.  Areola somewhat compressible.  Tried sandwiching the breast multiple times, baby opens but unable to sustain a deep areolar grasp, and causing pinching pain, and falls asleep.  Initiated a 20 mm, then switched to a 16 mm nipple shield.  Baby latched on and sucked/swallowed for about 5 minutes on and off.  Mom encouraged with this, as colostrum filled the nipple shield when she came off.  Talked about the benefits of wearing breast shells to help evert nipple, and using manual pump prior to latch with a nipple shield.  Reassured Mom that baby is going to do well once she wakes and becomes more hungry.  Showed Mom the size of newborn's stomach, and how baby needs teaspoons at a time.  Encouraged continued skin to skin, and watching for cues.  To call for assistance from her RN, which has been updated on tools we are using to assist in latching. Brochure given to Mom.  Explained about IP and OP lactation services available.  To call for help prn  Maternal Data Formula Feeding for Exclusion: No Infant to breast within first hour of birth: Yes Has patient been taught Hand Expression?: Yes Does the patient have breastfeeding experience prior to this delivery?: No  Feeding Feeding Type: Breast Fed Length of feed: 5 min  LATCH Score/Interventions Latch: Repeated attempts  needed to sustain latch, nipple held in mouth throughout feeding, stimulation needed to elicit sucking reflex. Intervention(s): Skin to skin;Teach feeding cues;Waking techniques Intervention(s): Breast compression;Breast massage;Assist with latch;Adjust position  Audible Swallowing: A few with stimulation Intervention(s): Hand expression;Skin to skin Intervention(s): Skin to skin;Hand expression;Alternate breast massage  Type of Nipple: Flat Intervention(s): Shells;Hand pump  Comfort (Breast/Nipple): Filling, red/small blisters or bruises, mild/mod discomfort  Problem noted: Mild/Moderate discomfort (both nipples red) Interventions (Mild/moderate discomfort): Hand massage;Hand expression;Pre-pump if needed  Hold (Positioning): Assistance needed to correctly position infant at breast and maintain latch. Intervention(s): Breastfeeding basics reviewed;Support Pillows;Position options;Skin to skin  LATCH Score: 5  Lactation Tools Discussed/Used Tools: Shells;Nipple Shields Nipple shield size: 16 Shell Type: Inverted   Consult Status Consult Status: Follow-up Date: 06/18/13 Follow-up type: In-patient    Judee Clara 06/18/2013, 11:59 AM

## 2013-06-19 MED ORDER — METOPROLOL TARTRATE 25 MG PO TABS
25.0000 mg | ORAL_TABLET | Freq: Two times a day (BID) | ORAL | Status: DC
  Administered 2013-06-19 – 2013-06-20 (×2): 25 mg via ORAL
  Filled 2013-06-19 (×4): qty 1

## 2013-06-19 NOTE — Lactation Note (Addendum)
This note was copied from the chart of Susan Hines. Lactation Consultation Note  Patient Name: Susan Hines [id number] Date: 06/19/2013 Reason for consult: Follow-up assessment;Difficult latch Mom has been having difficulty with latch. Mom reports baby would not take nipple shield. She c/o of nipple soreness, redness noted, no breakdown. Care for sore nipples reviewed. Comfort gels given with instructions. Assisted Mom with latching baby. We tried without nipple shield, baby could latch but could not sustain the latch. Applied #16 nipple shield, baby latched easily in football hold at this visit, some colostrum present in the nipple shield. Mom c/o of some discomfort so changed to size 20 nipple shield and this improved. Colostrum present with the #20 as well. Advised Mom baby should become more interested in BF, cluster feeding reviewed. Encouraged Mom to post pump after feeding to encourage milk production and give baby back any amount of EBM she obtains. Advised Mom to ask for assist as needed. Mom concerned about anti-depressant  Prozac with BF. Advised L2 per [place] Slider. To watch for sleepiness and not waking to feed.  Baby is noted to have a short, anterior frenulum, some dimpling noted with tongue extension. The baby is able to extend her tongue past the gum line to the bottom lip. Baby began giving feeding ques as I was finishing my visit. Assisted Mom to latch on the right breast. Mom needed #16 nipple shield for right breast, #20 for left breast.   Maternal Data    Feeding Feeding Type: Breast Fed Length of feed: 10 min  LATCH Score/Interventions Latch: Repeated attempts needed to sustain latch, nipple held in mouth throughout feeding, stimulation needed to elicit sucking reflex. (using #16/20 nipple shield) Intervention(s): Adjust position;Assist with latch;Breast massage;Breast compression  Audible Swallowing: A few with stimulation Intervention(s): Skin to  skin Intervention(s): Skin to skin  Type of Nipple: Everted at rest and after stimulation (short npple shaft) Intervention(s): Double electric pump  Comfort (Breast/Nipple): Soft / non-tender  Interventions (Mild/moderate discomfort): Comfort gels  Hold (Positioning): Assistance needed to correctly position infant at breast and maintain latch. Intervention(s): Breastfeeding basics reviewed;Support Pillows;Position options;Skin to skin  LATCH Score: 7  Lactation Tools Discussed/Used Tools: Shells;Nipple Shields;Pump;Comfort gels Nipple shield size: 20;16 Shell Type: Inverted Breast pump type: Double-Electric Breast Pump   Consult Status Consult Status: Follow-up Date: 06/20/13 Follow-up type: In-patient    Alfred Levins 06/19/2013, 12:58 PM

## 2013-06-19 NOTE — Progress Notes (Addendum)
Post Partum Day 1 Subjective: no complaints, up ad lib, voiding, tolerating PO and nl lochia, pain controlled  Pt concerned over elevation in WBC, will recheck in AM  Objective: Blood pressure 96/67, pulse 100, temperature 98 F (36.7 C), temperature source Oral, resp. rate 18, height 5' (1.524 m), weight 77.111 kg (170 lb), last menstrual period 09/15/2012, SpO2 100.00%, unknown if currently breastfeeding.  Physical Exam:  General: alert and no distress Lochia: appropriate Uterine Fundus: firm   Recent Labs  06/16/13 2030 06/18/13 0606  HGB 10.3* 8.1*  HCT 31.9* 24.7*    Assessment/Plan: Plan for discharge tomorrow, Breastfeeding and Lactation consult.  Routine care.    LOS: 3 days   BOVARD,[place] 06/19/2013, 6:07 AM

## 2013-06-20 LAB — CBC
Hemoglobin: 6.9 g/dL — CL (ref 12.0–15.0)
MCH: 26.5 pg (ref 26.0–34.0)
MCHC: 32.9 g/dL (ref 30.0–36.0)
Platelets: 258 10*3/uL (ref 150–400)
RBC: 2.6 MIL/uL — ABNORMAL LOW (ref 3.87–5.11)

## 2013-06-20 MED ORDER — PRENATAL MULTIVITAMIN CH: 1.0000 | ORAL_TABLET | Freq: Every day | ORAL | Status: DC

## 2013-06-20 MED ORDER — OXYCODONE-ACETAMINOPHEN 5-325 MG PO TABS: 1.0000 | ORAL_TABLET | Freq: Four times a day (QID) | ORAL | Status: DC | PRN

## 2013-06-20 MED ORDER — IBUPROFEN 800 MG PO TABS: 800.0000 mg | ORAL_TABLET | Freq: Three times a day (TID) | ORAL | Status: DC | PRN

## 2013-06-20 NOTE — Lactation Note (Signed)
This note was copied from the chart of Susan Gypsy Kellogg. Lactation Consultation Note  Patient Name: Susan Hines [id number] Date: 06/20/2013 Reason for consult: Follow-up assessment;Difficult latch;Hyperbilirubinemia;Infant < 6lbs Baby on double photo therapy. Baby is latching using #16 nipple shield. Mom c/o of continued discomfort with baby at the breast. Large positional stripe/bruise on left nipple, slight bruising on right nipple. Mom has been BF in football hold. Assisted Mom at this feeding to latch baby in cross cradle hold on the right breast.  Also adjusted how Mom is supporting her breast. Mom reported less discomfort with this feeding. Baby demonstrated a good rhythmic suck, good swallows audible. Started with #16 nipple shield, baby transferring milk well. Changed to #20, baby also transferred milk well, but the #20 nipple shield appeared too large. Advised Mom to use #16. Mom's breasts are filling. Engorgement care reviewed. Advised to ask for assist as needed.   Maternal Data    Feeding Feeding Type: Breast Fed Length of feed: 20 min  LATCH Score/Interventions Latch: Grasps breast easily, tongue down, lips flanged, rhythmical sucking. (using nipple shield) Intervention(s): Adjust position;Assist with latch  Audible Swallowing: Spontaneous and intermittent  Type of Nipple: Flat (short nipple shaft)  Comfort (Breast/Nipple): Filling, red/small blisters or bruises, mild/mod discomfort  Problem noted: Filling;Cracked, bleeding, blisters, bruises;Mild/Moderate discomfort Interventions  (Cracked/bleeding/bruising/blister): Expressed breast milk to nipple;Double electric pump Interventions (Mild/moderate discomfort): Comfort gels  Hold (Positioning): Assistance needed to correctly position infant at breast and maintain latch.  LATCH Score: 7  Lactation Tools Discussed/Used Tools: Nipple Shields;Pump;Comfort gels Nipple shield size: 16;20 Breast pump type:  Double-Electric Breast Pump   Consult Status Consult Status: Follow-up Date: 06/21/13 Follow-up type: In-patient    Alfred Levins 06/20/2013, 3:02 PM

## 2013-06-20 NOTE — Discharge Summary (Signed)
Obstetric Discharge Summary Reason for Admission: induction of labor Prenatal Procedures: none Intrapartum Procedures: spontaneous vaginal delivery Postpartum Procedures: none Complications-Operative and Postpartum: 2nd degree perineal laceration Hemoglobin  Date Value Range Status  06/20/2013 6.9* 12.0 - 15.0 g/dL Final     REPEATED TO VERIFY     CRITICAL RESULT CALLED TO, READ BACK BY AND VERIFIED WITH:     JAMES, J. @ 0640 ON 06/20/2013 BY [email or web address].A     HCT  Date Value Range Status  06/20/2013 21.0* 36.0 - 46.0 % Final    Physical Exam:  General: alert and no distress Lochia: appropriate Uterine Fundus: firm  Discharge Diagnoses: Term Pregnancy-delivered  Discharge Information: Date: 06/20/2013 Activity: pelvic rest Diet: routine Medications: PNV, Ibuprofen and Percocet Condition: stable Instructions: refer to practice specific booklet Discharge to: home Follow-up Information   Follow up with BOVARD,Jezebelle Ledwell, MD. Schedule an appointment as soon as possible for a visit in 6 weeks.   Specialty:  Obstetrics and Gynecology   Contact information:   [address] [place] [address] [phone]       Newborn Data: Live born female  Birth Weight: 6 lb 1.5 oz (2765 g) APGAR: 8, 9  Home with mother.  BOVARD,Bambi Fehnel 06/20/2013, 9:16 AM

## 2013-06-20 NOTE — Progress Notes (Signed)
Post Partum Day 2 Subjective: no complaints, up ad lib, voiding, tolerating PO and nl lochia, pain controlled.  WBC decreased, Hgb stable  Objective: Blood pressure 94/62, pulse 111, temperature 98.2 F (36.8 C), temperature source Axillary, resp. rate 18, height 5' (1.524 m), weight 77.111 kg (170 lb), last menstrual period 09/15/2012, SpO2 100.00%, unknown if currently breastfeeding.  Physical Exam:  General: alert and no distress Lochia: appropriate Uterine Fundus: firm   Recent Labs  06/18/13 0606 06/20/13 0610  HGB 8.1* 6.9*  HCT 24.7* 21.0*    Assessment/Plan: Discharge home, Breastfeeding and Lactation consult.  D/C with motrin, percocet, pnv.  F/u 6 weeks.     LOS: 4 days   BOVARD,Anay Walter 06/20/2013, 7:55 AM

## 2013-06-21 NOTE — Progress Notes (Signed)
LATE ENTRY FROM 06/20/13:  Patient was referred for history of depression/anxiety. * Referral screened out by Clinical Social Worker because none of the following criteria appear to apply:  ~ History of anxiety/depression during this pregnancy, or of post-partum depression.  ~ Diagnosis of anxiety and/or depression within last 3 years  ~ History of depression due to pregnancy loss/loss of child  OR * Patient's symptoms currently being treated with medication and/or therapy.  Please contact the Clinical Social Worker if needs arise, or by the patient's request. 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  She was prescribed Prozac of which she took for 1 1/2 years.  Doing well now but will seek medical attention if needed.

## 2013-06-24 ENCOUNTER — Ambulatory Visit (HOSPITAL_COMMUNITY)
Admission: RE | Admit: 2013-06-24 | Discharge: 2013-06-24 | Disposition: A | Discharge status: Home / Self Care | Payer: 59 | Source: Ambulatory Visit | Attending: Family Medicine | Admitting: Family Medicine

## 2013-06-24 NOTE — Lactation Note (Signed)
Adult Lactation Consultation Outpatient Visit Note  Patient Name: Susan Hines   Baby: Lovada Barwick  Date of Birth: [birth year]   DOB: [birth year] Gestational Age at Delivery: [redacted]w[redacted]d  BW: 6# 1.5oz [phone]) Type of Delivery:     Today's weight: 5# 15.1oz (6045W) (2.5% below BW)  Breastfeeding History: Frequency of Breastfeeding: hasn't been to the breast since Tues evening    Length of Feeding:  Voids: clear/light yellow  Stools: yellow/seedy (multiple)  Supplementing / Method: Pumping:  Type of Pump:Medela PIS   Frequency: q3h X 20 min  Volume:  100-120 mL  Bottles of 70- 80mL  Consultation Evaluation:  Initial Feeding Assessment: Pre-feed Weight: 2696g Post-feed [id number]  Amount Transferred: 40mL Comments:R breast (w/nipple shield)  Total Breast milk Transferred this Visit: 62mL+    Follow-Up Mom has not put baby to the breast since Tues evening b/c of intense nipple pain.  Since then, Mom has been solely pumping & BO.  Mom interested in putting baby back to breast, as nipples have mostly healed. Baby latched to R side (w/size 20 nipple shield) w/relative ease.  Mom comfortable during feeding. Baby took 40mL in 7 min.  Parents were beginning to leave and baby showed feeding cues.  Baby put to L breast (w/nipple shield) and baby latched well.  Mom did have some initial discomfort, but it subsided within a normal amount of time.  Baby was not reweighed b/c she had already been fully dressed, but baby was satiated within less than 10 min.   Mom has an abundant milk supply.  It is possible that one of the reasons for discomfort was that baby was purposely narrowing gape to slow milk flow.  Mom taught previously about nursing in a laid-back position.    Dad taught how to pace-bottle feed if bottle-feeding becomes necessary again.  Parents also given a hand-out on flow of bottle nipples.  Parents had been giving 70-6mL in a BO, which baby would then spit up.  Parents advised that  45-36mL is a more appropriate volume at this time.   When Mom is ready, she may begin to offer the breast w/o the nipple shield (and wear shells to help evert nipples).   Lurline Hare [facility] 06/24/2013, 3:59 PM

## 2013-06-28 ENCOUNTER — Ambulatory Visit (HOSPITAL_COMMUNITY)
Admission: RE | Admit: 2013-06-28 | Discharge: 2013-06-28 | Disposition: A | Discharge status: Home / Self Care | Payer: 59 | Source: Ambulatory Visit | Attending: Obstetrics and Gynecology | Admitting: Obstetrics and Gynecology

## 2013-06-28 NOTE — Lactation Note (Addendum)
Adult Lactation Consultation Outpatient Visit Note  Patient Name: Susan Hines Gestational Age at Delivery: [redacted]w[redacted]d Type of Delivery: Vaginal Delivery , induced per mom with a lot of swelling  DOB for Susan Hines - 06/19/2025  BW- 6-1 oz  D/C weight- 5-13 oz 1st MD visit - 5-14 oz 11/28 visit - 5-15 oz  Reason for [facility] visit today - F/U from [facility] visit 11/28 , use of nipple shield and sore nipples ( desire to work on re- latching )   Breastfeeding History: Frequency of Breastfeeding: 2X's in the last 24 hours with #20 NS ( only 2 x's due to soreness)  Length of Feeding: 10- 15 mins and then I switch her to the other breast  Voids: >6  Stools: >6 yellow   Supplementing / Method: per mom due to soreness , when receiving a bottle , baby takes - 70-80 ml with Advent nipple , Medela to fast  Pumping:  Type of Pump:DEBP , Medela    Frequency:  Per mom every 3 hours 15 mins   Volume:  70-100 ml   Comments: Per mom pumping is going well and able to soften breast . Milk supply is adequate for her days post partum ,  Using #24 flanges with comfort , watched mom pump     Consultation Evaluation: Both breast full, few lateral nodules both breast , nipples pink in color ( no break down of skin , or signs of yeast )                                               Baby slightly jaundice in the face , upper chest , awake, rooting , lips moist. LC noted a "Short anterior Frenulum " ,                                               @ 1st baby did not stretch tongue over gum line , eventually did slightly . ( LC suspects this may be the cause of moms sore nipples )                                                LC did recommended to mom with LC plan below to prepump off some of the fullness, to assist with depth and the Baby stretching her tongue.                                                If soreness didn't improve LC would recommend having the frenulum clipped .   Initial Feeding Assessment: Pre-feed  Weight:6-3.3 oz, 2814 g  Post-feed Weight: 6-4.9 oz 2862 g  Amount Transferred: 48 ml  Comments: left breast - football , mom does well with positioning, needed review with proper fitting with NS, #20  Once the baby latched, obtained depth , with flange lips, no dimpling, multiply swallows,                                         increased with breast compressions. Per mom complained of intermittent pinching and                                         discomfort, improved with breast compressions. Nipple appeared misshaped when the                                         baby released. Mom has a great let down , easily hand expresses. Breast softened somewhat ,                                          re- latch same breast below.   Additional Feeding Assessment: Pre-feed Weight: 6-4.9 oz 2862 g - reweigh after wet and stool ) , 6-4.5 oz 2850 g  Post-feed Weight: 6-5.3 oz 2870 g  Amount Transferred:20 ml  Comments: left breast - Latched well in cross cradle with depth, multiply swallows noted and milk in the nipple shield after baby re-leased.  Additional Feeding Assessment: Pre-feed Weight:6-5.3 oz 2870g  Post-feed Weight: 6-5.4 oz 2872 g  Amount Transferred: 2 ml  Comments: left breast , re-latched for a short snack with the #20 NS , with depth and milk noted in the NS   Additional Feeding Assessment - Pre weight : 6-5.4 oz 2872 g  Post weight : 6-5.8 oz 2888 g  Amount Transferred: 16 ml  Comments - Right breast - latched in football position without the nipple shield after prepump 5 mins to release the fullness,                                             nipple erect and areola compress able. Susan Hines latched with obtaining depth with breast compressions                                             and LC assisting mom , comfort achieved after 4-5 mins and baby fed for 10 mins. Multiply swallows noted and gulps,                                               Flanged lips maintained with latch. When Susan Hines released upper portion of the nipple noted to be misshaped.   Total Breast milk Transferred this Visit: 86 ml  Total Supplement Given: ( after the baby was finished , dressed , baby rooting ,  mom supplemented with EBM from a Avent bottle, ( small amount - mom did not say )   Lactation Plan  of Care - Praised mom for her efforts breast feeding and protecting her established milk supply                                          - Mom - plenty fluids ( esp . Water ), nutritious snacks and meals , rest , naps                                          - Comfort gels after feedings or pumping to both breast ( throw out at 6 days)                                          - Goal- Protect and maintain milk supply                                          - work on feeding Baby at the breast without nipple shield                                          - Steps for latching - Breast massage, hand express, prepump 3-4 mins , or use hand pump                                           10 -15 strokes to take off the fullness so depth and be obtained .                                         - Latch with firm support with breast compressions until comfort is achieved                                          - Check Susan Hines's lips to make sure they are flanged , and intermittent breast compressions with latch.                                         - If Susan Hines is receiving a bottle , pump both breast for 15 -20 mins ,                                          - If using a nipple shield , , always soften 1st breast and offer 2nd breast , if she doesn't feed release down.                                          -  See LC recommendation if soreness doesn't improve by following the [facility] plan.                                           - 1 package of comfort gels given with instructions   Follow-Up- Per mom F/U apt. With [facility] ?12/10                    - LC -  recommended working on the plan and giving baby more latch time at the breast ( working on depth                               If the soreness doesn't improve - Have a discussion with the Pedis MD for clipping of the short frenulum ,                   - LC also recommended the BFSG on Tuesday's 11am , and Monday evenings 7 pm 2nd and 4 th .       Susan Hines 06/28/2013, 4:06 PM

## 2013-06-29 ENCOUNTER — Ambulatory Visit (HOSPITAL_COMMUNITY): Payer: 59

## 2013-08-24 ENCOUNTER — Ambulatory Visit (HOSPITAL_COMMUNITY)
Admission: RE | Admit: 2013-08-24 | Discharge: 2013-08-24 | Disposition: A | Discharge status: Home / Self Care | Payer: 59 | Source: Ambulatory Visit | Attending: Obstetrics and Gynecology | Admitting: Obstetrics and Gynecology

## 2013-08-24 NOTE — Lactation Note (Signed)
Adult Lactation Consultation Outpatient Visit Note  Patient Name: Susan Hines                                                       "Susan Hines" Date of Birth: 03/11/1991                                                                Weight today: 9-12.5, 4439 Gestational Age at Delivery: Unknown Type of Delivery:   Breastfeeding History: Frequency of Breastfeeding: one to times daily Length of Feeding: 15 prophilactic Voids: 6 large wets Stools: 4 yellow  Supplementing / Method: bottle feeds using 4-4 1/2 ounces every 3-4 hours Pumping:  Type of Pump:Medela Pump  Style   Frequency:5 times daily for 20 min's  Volume:  3-6 ounces  Comments:Mother began to complain of yeast symptoms 5 weeks ago. She was treated with Diflucan for 2 weeks. Mother states that she doesn't think that symptoms every went away. She complaints of painful latch to the point of not breastfeeding her infant only once a day. She states she has severe pain when pumping her breast.      Consultation Evaluation:Mother has very deep pink nipple tissue, red shinny areas around the areola. She  complaints of itching, stinging and burning pain. She does have a small crack on the tip of the (p) nipple. She has been using APNO. Mother states she has a history of frequent vaginal yeast infections. She states too many to count. She did receive a prophylactic dose of antibiotics at delivery.  Initial Feeding Assessment: assist mother with latching infant using a # 24 Nipple Shield. Infant sustained latch well for 18-20 mins. Infant transferred 57 ml . Mother states latch and feeding was a #3 pain scale for entire feeding. Pre-feed [id number]:4439 Post-feed [id number]:[id number]:4496 Amount Transferred:57 ml Comments:  Additional Feeding Assessment:observed good deep latch for 15-20 mins. Infant transferred 56 ml.  Pre-feed [id number]:2440Weight:4496 Post-feed [id number]:3664Weight:4552 Amount Transferred:56 ml Comments:   Total Breast milk Transferred this Visit: 113  ml Total Supplement Given:   Additional Interventions: Recommend that mother phone her OB for RX for Diflucan.  Recommend 400 mg for a one time dose and follow with 200 mg daily given 100mg  BID.for atleast 28 days Mother to follow up for feeding assessment in one week. Advised mother to continue to post pump for 20 mins after feeding.  Mother was given written guidelines on preventative measures for yeast.   Discussed use of Mothers Milk Plus   Follow-Up   February 4 at 4 pm.   Stevan BornKendrick, Kenyatta Keidel [facility] 08/24/2013, 7:33 PM

## 2013-08-25 ENCOUNTER — Telehealth (HOSPITAL_COMMUNITY): Payer: Self-pay

## 2013-08-25 NOTE — Lactation Note (Signed)
Adult Lactation Consultation Outpatient Visit Note  [facility] clarify diflucan recommendation.  She also plans to call alcohol free in to the pharmacy for her to apply to her nipples.  Patient Name: Susan Hines Date of Birth: 10/17/1990 Gestational Age at Delivery: Unknown Type of Delivery:   Breastfeeding History: Frequency of Breastfeeding:  Length of Feeding:  Voids:  Stools:   Supplementing / Method: Pumping:  Type of Pump:   Frequency:  Volume:    Comments:    Consultation Evaluation:  Initial Feeding Assessment: Pre-feed Weight: Post-feed Weight: Amount Transferred: Comments:  Additional Feeding Assessment: Pre-feed Weight: Post-feed Weight: Amount Transferred: Comments:  Additional Feeding Assessment: Pre-feed Weight: Post-feed Weight: Amount Transferred: Comments:  Total Breast milk Transferred this Visit:  Total Supplement Given:   Additional Interventions:   Follow-Up      Susan Hines, Susan Hines 08/25/2013, 3:15 PM

## 2013-08-26 ENCOUNTER — Ambulatory Visit (HOSPITAL_COMMUNITY): Admission: RE | Admit: 2013-08-26 | Payer: 59 | Source: Ambulatory Visit

## 2013-08-31 ENCOUNTER — Ambulatory Visit (HOSPITAL_COMMUNITY)
Admission: RE | Admit: 2013-08-31 | Discharge: 2013-08-31 | Disposition: A | Discharge status: Home / Self Care | Payer: 59 | Source: Ambulatory Visit | Attending: Obstetrics and Gynecology | Admitting: Obstetrics and Gynecology

## 2013-08-31 NOTE — Lactation Note (Addendum)
Adult Lactation Consultation Outpatient Visit Note  Patient Name: Susan Hines, DOB [birth year], now 2 months old Date of Birth: [birth year] Gestational Age at Delivery: Unknown Type of Delivery: SVB  Breastfeeding History: Frequency of Breastfeeding: 1 time per day Length of Feeding: 10-15 minutes Voids: 6 per day Stools: 3-4 per day/yellow in color  Supplementing / Method: Pumping:  Type of Pump:   Pump N Style   Frequency:    Every 3 hours during the day, 5 times day  Volume:  3 - 7 1/2 oz   Comments: Mom is here for follow up feeding assessment after appointment last week. Mom has been struggling with yeast at the breast. She went through 1 course of treatment which did not resolve the yeast. She started her second course of Diflucan last week, 08/25/13.  She is currently talking 100 mg Diflucan BID, using All Purpose Nipple Cream and has applied gentian violet to breast for the past 4 days. Baby is currently on Nystatin cream.  Mom reports less burning on the aerola/nipple but the itching has not decreased. Mom stopped putting baby to the breast due to pain. After her visit last week she has been putting the baby to the breast 1 time per day using the #24 nipple shield but reports baby is fussy at the breast and not wanting to sustain a latch. Baby is mostly bottle fed using Avent bottle. Mom reports baby is taking 4 1/2 oz of EBM every 3-4 hours. She usually needs to use 2 oz of Enfamil per day. Mom would like to get baby back to the breast consistently.    Consultation Evaluation: On exam, Baby's mouth does not have any white patches or visual evidence of thrush. Mom's nipples are red, aerola is red, dry, nipples are scaly. Mom reports she thinks baby is fussy at the breast due to slower milk flow. LC decided to try SNS with nipple shield to see if baby would sustain a latch. Baby was very fussy even with pre-loading the nipple shield  using the SNS with EBM. Mom had difficulty keeping the #24 nipple shield on, changed to size 20 then went down to size 16. Mom tolerated baby at the breast, however baby would suckle few times then come off the breast very fussy. Could get Baby Susan Hines to sustain a suckling pattern by applying pressure to SNS to increase the flow of milk. After about 10 minutes, we gave Baby Susan Hines an appetizer of EBM with bottle and then re-latched her without the SNS but pre-loading the nipple shield with EBM via curved tipped syringe.  Changed Mom's hold on breast to dancer hold and Earnestine Leys worked better at the breast but was still fussy with the flow. Same experience on the left breast as the right. After working with her at the breast for 30 minutes, we finished the feeding with the Avent bottle.   Initial Feeding Assessment: Pre-feed Weight:   9 lb. 15.6 oz/4524 gm Post-feed Weight:   10 lb. 5.0 oz/4676 gm Amount Transferred:  Baby Susan Hines transferred 47 ml while at breast and took 105 ml of EBM via bottle for a total of 152 ml. (approx 5 oz with this feeding).  Comments:  Mom reported with SNS  and nipple shield the baby was chewing more at the breast and did not have a strong suck. LC observed this as well.  With the nipple shield by itself and changing to smaller size she felt more suckling when baby would breastfeed and baby demonstrated a better suckling pattern.   Additional Feeding Assessment: Pre-feed Weight: Post-feed Weight: Amount Transferred: Comments:  Additional Feeding Assessment: Pre-feed Weight: Post-feed Weight: Amount Transferred: Comments:  Total Breast milk Transferred this Visit: 47 ml from breast, 105 ml of EBM taken via bottle, total of 152 ml  Total Supplement Given:   Additional Interventions: Mom to start probiotics to help with yeast. Reviewed diet and to stay awake from sugars, carbs, etc.  Continue Diflucan as prescribed. She has another 3 weeks course to finish. Advised  to call Dr. Naida Sleightavid Spencer[place].  Advised to use Coconut oil or Extra Virgin Olive Oil for nipple dryness and itching. With feeding the baby. Discussed with Mom if she wants to get baby back to breast she needs to offer breast with every feeding, except at night if tired, to retrain Susan Hines. Advised Mom to pre-pump to get her milk flow going. Latch baby using the #16 or #20 nipple shield, which ever feels better to Mom. Pre-load the nipple shield with EBM. Try to keep her actively nursing for 15-20 minutes but if she becomes extremely frustrated, take her off the breast and give her an appetizer of EBM via bottle and then try to re-latch. Start using bottle with valve system to control the milk flow, eg. Calma, Dr. Manson PasseyBrown or Tommy Tippy.  Mom needs to continue to post pump every 3 hours to protect her milk supply till Earnestine LeysBaby Susan Hines is consistently sustaining a latch and nursing 8-12 times in 24 hours. Give EBM back to baby.  Monitor voids/stools.  Also discussed option of breast and bottle feeding if baby Susan Hines does not go back to breast.   Follow-Up Mom will call if she desire follow up. Encouraged support groups.      Alfred LevinsGranger, Brinley Rosete Ann 08/31/2013, 6:22 PM

## 2014-05-29 ENCOUNTER — Encounter (HOSPITAL_COMMUNITY): Payer: Self-pay

## 2014-08-12 NOTE — Telephone Encounter (Signed)
Not applicable

## 2016-04-14 ENCOUNTER — Inpatient Hospital Stay (HOSPITAL_COMMUNITY)
Admission: AD | Admit: 2016-04-14 | Discharge: 2016-04-14 | Disposition: A | Discharge status: Home / Self Care | Payer: Self-pay | Source: Ambulatory Visit | Attending: Obstetrics & Gynecology | Admitting: Obstetrics & Gynecology

## 2016-04-14 ENCOUNTER — Encounter (HOSPITAL_COMMUNITY): Payer: Self-pay | Admitting: *Deleted

## 2016-04-14 DIAGNOSIS — O219 Vomiting of pregnancy, unspecified: Secondary | ICD-10-CM

## 2016-04-14 DIAGNOSIS — Z3A01 Less than 8 weeks gestation of pregnancy: Secondary | ICD-10-CM | POA: Insufficient documentation

## 2016-04-14 DIAGNOSIS — O210 Mild hyperemesis gravidarum: Secondary | ICD-10-CM | POA: Insufficient documentation

## 2016-04-14 DIAGNOSIS — Z880 Allergy status to penicillin: Secondary | ICD-10-CM | POA: Insufficient documentation

## 2016-04-14 HISTORY — DX: Anemia, unspecified: D64.9

## 2016-04-14 LAB — URINE MICROSCOPIC-ADD ON

## 2016-04-14 LAB — URINALYSIS, ROUTINE W REFLEX MICROSCOPIC
Bilirubin Urine: NEGATIVE
GLUCOSE, UA: NEGATIVE mg/dL
HGB URINE DIPSTICK: NEGATIVE
Ketones, ur: NEGATIVE mg/dL
Nitrite: NEGATIVE
Protein, ur: 30 mg/dL — AB
SPECIFIC GRAVITY, URINE: 1.02 (ref 1.005–1.030)
pH: 8 (ref 5.0–8.0)

## 2016-04-14 LAB — POCT PREGNANCY, URINE: Preg Test, Ur: POSITIVE — AB

## 2016-04-14 MED ORDER — METOCLOPRAMIDE HCL 10 MG PO TABS: 10.0000 mg | ORAL_TABLET | Freq: Three times a day (TID) | ORAL | 1 refills | Status: DC

## 2016-04-14 MED ORDER — LACTATED RINGERS IV BOLUS (SEPSIS)
1000.0000 mL | Freq: Once | INTRAVENOUS | Status: AC
  Administered 2016-04-14: 1000 mL via INTRAVENOUS

## 2016-04-14 MED ORDER — METOCLOPRAMIDE HCL 5 MG/ML IJ SOLN
10.0000 mg | Freq: Once | INTRAMUSCULAR | Status: AC
  Administered 2016-04-14: 10 mg via INTRAVENOUS
  Filled 2016-04-14: qty 2

## 2016-04-14 NOTE — MAU Provider Note (Signed)
History     CSN: [id number]  Arrival date and time: 04/14/16 1256   First Provider Initiated Contact with Patient 04/14/16 1515      Chief Complaint  Patient presents with  . Emesis During Pregnancy   HPI   Susan Hines is a 25 y.o. female G2P1001 @ 5742w5d here in MAU with Nausea and vomiting. The nausea is worse than the actual vomiting. She has vomited 3 times in the last 24 hours. She is constantly dry heaving. She is able to keep sips of water down only. She is also able to keep chips at times.  She denies abdominal pain or vaginal bleeding.   She is taking unisom and B6 at home for the symptoms- this is helping only minimally.   OB History    Gravida Para Term Preterm AB Living   2 1 1     1    SAB TAB Ectopic Multiple Live Births           1      Past Medical History:  Diagnosis Date  . Anemia   . Anxiety   . Bacterial vaginosis   . Headache(784.0)   . Hx of varicella   . Infection   . IUGR (intrauterine growth restriction) 06/16/2013  . Migraines   . Paroxysmal tachycardia (HCC)   . SVD (spontaneous vaginal delivery) 06/18/2013  . Yeast cystitis     Past Surgical History:  Procedure Laterality Date  . LAPAROSCOPIC ABDOMINAL EXPLORATION    . WISDOM TOOTH EXTRACTION      Family History  Problem Relation Age of Onset  . Heart disease Maternal Grandmother   . Heart disease Maternal Grandfather   . Cancer Paternal Grandmother     Social History  Substance Use Topics  . Smoking status: Never Smoker  . Smokeless tobacco: Never Used  . Alcohol use No    Allergies:  Allergies  Allergen Reactions  . Augmentin [Amoxicillin-Pot Clavulanate] Diarrhea and Itching    No prescriptions prior to admission.   Results for orders placed or performed during the hospital encounter of 04/14/16 (from the past 48 hour(s))  Urinalysis, Routine w reflex microscopic (not [facility])     Status: Abnormal   Collection Time: 04/14/16  1:06 PM  Result Value Ref Range    Color, Urine YELLOW YELLOW   APPearance CLOUDY (A) CLEAR   Specific Gravity, Urine 1.020 1.005 - 1.030   pH 8.0 5.0 - 8.0   Glucose, UA NEGATIVE NEGATIVE mg/dL   Hgb urine dipstick NEGATIVE NEGATIVE   Bilirubin Urine NEGATIVE NEGATIVE   Ketones, ur NEGATIVE NEGATIVE mg/dL   Protein, ur 30 (A) NEGATIVE mg/dL   Nitrite NEGATIVE NEGATIVE   Leukocytes, UA SMALL (A) NEGATIVE  Urine microscopic-add on     Status: Abnormal   Collection Time: 04/14/16  1:06 PM  Result Value Ref Range   Squamous Epithelial / LPF 6-30 (A) NONE SEEN   WBC, UA 0-5 0 - 5 WBC/hpf   RBC / HPF 0-5 0 - 5 RBC/hpf   Bacteria, UA FEW (A) NONE SEEN   Crystals CA OXALATE CRYSTALS (A) NEGATIVE   Urine-Other MUCOUS PRESENT   Pregnancy, urine POC     Status: Abnormal   Collection Time: 04/14/16  1:30 PM  Result Value Ref Range   Preg Test, Ur POSITIVE (A) NEGATIVE    Comment:        THE SENSITIVITY OF THIS METHODOLOGY IS >24 mIU/mL     Review of Systems  Gastrointestinal: Positive for heartburn, nausea and vomiting.  Genitourinary: Negative for dysuria.  Neurological: Positive for weakness.   Physical Exam   Blood pressure 109/67, pulse 101, temperature 98.4 F (36.9 C), temperature source Oral, resp. rate 16, height 5' (1.524 m), weight 155 lb (70.3 kg), last menstrual period 02/20/2016, unknown if currently breastfeeding.  Physical Exam  Constitutional: She is oriented to person, place, and time. She appears well-developed and well-nourished. No distress.  Musculoskeletal: Normal range of motion.  Neurological: She is alert and oriented to person, place, and time.  Skin: Skin is warm. She is not diaphoretic.  Psychiatric: Her behavior is normal.    MAU Course  Procedures  None  MDM Urine culture pending LR bolus X 1 Reglan 10 mg IV Patient tolerating PO fluids at this time Discussed patient with Dr. Juliene Pina.   Assessment and Plan   A:  1. Nausea and vomiting during pregnancy      P:   Discharge home in stable condition Rx: Reglan- discussed reasons to discontinue Keep appointment with [facility] Return to MAU if symptoms worsen Small, frequent meals.   Duane Lope, NP 04/14/2016 3:19 PM

## 2016-04-14 NOTE — Discharge Instructions (Signed)

## 2016-04-14 NOTE — MAU Note (Signed)
Pt states she has been unable to eat or drink for the last 2 weeks. Thinks she has lost about 5 lbs.  Was advised by MD office to come to MAU.  Denies diarrhea or fever, feels weak.

## 2016-04-15 LAB — CULTURE, OB URINE: CULTURE: NO GROWTH

## 2016-04-30 LAB — OB RESULTS CONSOLE HEPATITIS B SURFACE ANTIGEN: HEP B S AG: NEGATIVE

## 2016-04-30 LAB — OB RESULTS CONSOLE ABO/RH: RH TYPE: POSITIVE

## 2016-04-30 LAB — OB RESULTS CONSOLE GC/CHLAMYDIA
Chlamydia: NEGATIVE
GC PROBE AMP, GENITAL: NEGATIVE

## 2016-04-30 LAB — OB RESULTS CONSOLE ANTIBODY SCREEN: ANTIBODY SCREEN: NEGATIVE

## 2016-04-30 LAB — OB RESULTS CONSOLE RPR: RPR: NONREACTIVE

## 2016-04-30 LAB — OB RESULTS CONSOLE HIV ANTIBODY (ROUTINE TESTING): HIV: NONREACTIVE

## 2016-06-27 DIAGNOSIS — A048 Other specified bacterial intestinal infections: Secondary | ICD-10-CM

## 2016-06-27 HISTORY — DX: Other specified bacterial intestinal infections: A04.8

## 2016-07-28 NOTE — L&D Delivery Note (Signed)
Delivery Note  First Stage: Labor onset: 1330 Augmentation : Pitocin, AROM  Analgesia /Anesthesia intrapartum: Epidural  AROM at 2242  Second Stage: Complete dilation at 0245 Onset of pushing at 0245 FHR second stage 1   Delivery of a viable female at 61 by CNM/SNM in ROA position No nuchal cord, wrapped around right leg  Cord double clamped after cessation of pulsation, cut by FOB Cord blood sample collected    Third Stage: Placenta delivered [place], 3 VC @ [phone], noted adherent clot to maternal side  Uterine tone Firm with massage / bleeding large with clots expressed- pitocin IV, cytotec 200 buccal 600 rectally   Left labial and vaginal laceration identified, secondary supraurethral superficial laceration not requiring repair-hemostatic  Anesthesia for repair: Local Lidocaine  Repair 3.0 vicryl  Est. Blood Loss (mL): 500  Complications: marginal abruption  Mom to postpartum.  Baby to Couplet care / Skin to Skin.  Newborn: Baby Name: Susan Hines  Birth Weight: pending  Apgar Scores: 8/9 Feeding planned: breast   Steward Drone BSN, SNM 11/21/2016, 3:32 AM

## 2016-09-26 ENCOUNTER — Inpatient Hospital Stay (HOSPITAL_COMMUNITY)
Admission: AD | Admit: 2016-09-26 | Discharge: 2016-09-26 | Disposition: A | Discharge status: Home / Self Care | Payer: Self-pay | Source: Ambulatory Visit | Attending: Obstetrics & Gynecology | Admitting: Obstetrics & Gynecology

## 2016-09-26 ENCOUNTER — Encounter (HOSPITAL_COMMUNITY): Payer: Self-pay | Admitting: *Deleted

## 2016-09-26 DIAGNOSIS — Z880 Allergy status to penicillin: Secondary | ICD-10-CM | POA: Insufficient documentation

## 2016-09-26 DIAGNOSIS — F419 Anxiety disorder, unspecified: Secondary | ICD-10-CM | POA: Insufficient documentation

## 2016-09-26 DIAGNOSIS — R109 Unspecified abdominal pain: Secondary | ICD-10-CM

## 2016-09-26 DIAGNOSIS — Z79899 Other long term (current) drug therapy: Secondary | ICD-10-CM | POA: Insufficient documentation

## 2016-09-26 DIAGNOSIS — O26893 Other specified pregnancy related conditions, third trimester: Secondary | ICD-10-CM | POA: Diagnosis present

## 2016-09-26 DIAGNOSIS — O99343 Other mental disorders complicating pregnancy, third trimester: Secondary | ICD-10-CM | POA: Insufficient documentation

## 2016-09-26 DIAGNOSIS — R1010 Upper abdominal pain, unspecified: Secondary | ICD-10-CM | POA: Insufficient documentation

## 2016-09-26 DIAGNOSIS — Z3A31 31 weeks gestation of pregnancy: Secondary | ICD-10-CM | POA: Insufficient documentation

## 2016-09-26 LAB — URINALYSIS, ROUTINE W REFLEX MICROSCOPIC
Bilirubin Urine: NEGATIVE
GLUCOSE, UA: NEGATIVE mg/dL
Hgb urine dipstick: NEGATIVE
KETONES UR: 40 mg/dL — AB
Nitrite: NEGATIVE
PH: 6 (ref 5.0–8.0)
Protein, ur: NEGATIVE mg/dL
Specific Gravity, Urine: 1.025 (ref 1.005–1.030)

## 2016-09-26 LAB — URINALYSIS, MICROSCOPIC (REFLEX)

## 2016-09-26 NOTE — MAU Provider Note (Signed)
History    Susan Hines is G2P1001 at 934w2d w/ EDB 11/26/16, here w/ c//o acute onset of upper abdominal pain this afternoon, after lunch. Notes usual food, + nausea , no emesis, no diarrhea. Also reports feeling dizzy in the shower and felt she had to get out and lay  Down on the floor until it passed, no LOC. Feels baby was moving less than usual until she arrived at hospital. Denies LOF/VB/ctx.   Pregnancy course complicated by low-lying placenta migrated from complete previa at 20 wks, last sono at 736w6d showed away from cervix. Persistent N/V on Zantac, changed to Prevacid and moderate improvement, has Zofran on hand but does not feel she need to take. H pylori tx in 1st trimester, ABX course completed.   Chief Complaint  Patient presents with  . Abdominal Pain  . Decreased Fetal Movement   HPI  OB History    Gravida Para Term Preterm AB Living   2 1 1     1    SAB TAB Ectopic Multiple Live Births           1      Past Medical History:  Diagnosis Date  . Anemia   . Anxiety   . Bacterial vaginosis   . Headache(784.0)   . Hx of varicella   . Infection   . IUGR (intrauterine growth restriction) 06/16/2013  . Migraines   . Paroxysmal tachycardia (HCC)   . SVD (spontaneous vaginal delivery) 06/18/2013  . Yeast cystitis     Past Surgical History:  Procedure Laterality Date  . LAPAROSCOPIC ABDOMINAL EXPLORATION    . WISDOM TOOTH EXTRACTION      Family History  Problem Relation Age of Onset  . Heart disease Maternal Grandmother   . Heart disease Maternal Grandfather   . Cancer Paternal Grandmother     Social History  Substance Use Topics  . Smoking status: Never Smoker  . Smokeless tobacco: Never Used  . Alcohol use No    Allergies:  Allergies  Allergen Reactions  . Augmentin [Amoxicillin-Pot Clavulanate] Diarrhea and Itching    Has patient had a PCN reaction causing immediate rash, facial/tongue/throat swelling, SOB or lightheadedness with hypotension: no Has  patient had a PCN reaction causing severe rash involving mucus membranes or skin necrosis: no Has patient had a PCN reaction that required hospitalization no Has patient had a PCN reaction occurring within the last 10 years: yes If all of the above answers are "NO", then may proceed with Cephalosporin use.     Prescriptions Prior to Admission  Medication Sig Dispense Refill Last Dose  . Ascorbic Acid (VITAMIN C PO) Take 1 tablet by mouth at bedtime.   09/25/2016 at Unknown time  . FLUoxetine (PROZAC) 20 MG capsule Take 20 mg by mouth daily.   09/25/2016 at Unknown time  . IRON PO Take 1 tablet by mouth at bedtime.   09/25/2016 at Unknown time  . lansoprazole (PREVACID) 30 MG capsule Take 30 mg by mouth at bedtime.   09/25/2016 at Unknown time  . Prenatal Vit-Fe Fumarate-FA (PRENATAL MULTIVITAMIN) TABS tablet Take 1 tablet by mouth at bedtime.   09/25/2016 at Unknown time  . metoCLOPramide (REGLAN) 10 MG tablet Take 1 tablet (10 mg total) by mouth 3 (three) times daily before meals. (Patient not taking: Reported on 09/26/2016) 30 tablet 1 Not Taking at Unknown time    ROS Physical Exam     Physical Exam:  Vitals:   09/26/16 1730  BP: 115/72  Pulse: 110  Resp: 16  Temp: 98.4 F (36.9 C)   General: NAD  Abd: Soft, NT, neg rebound, S=D  Ext: no edema  FHT 130, mod var, + accels, no decels Toco neg ctx  GU deferred.     ED Course  Procedures  NST reactive Orthostatic VS wnl   A/P: G2p1001 AT [redacted]W[redacted]D Upper abdominal pain, unclear etiology No evidence of viral GI / gallbladder disease / ulcer Possible gas distention Pain improving and good FM noted during visit FHT reassuring  Advised increased rest over the weekend, monitor for worsening symptoms.  Bland diet, avoid fried / fatty / spicy foods.   F/U as scheduled in office on Monday.     Neta Mends, CNM, MSN 09/26/2016, 6:53 PM

## 2016-09-26 NOTE — MAU Note (Signed)
Started out having pain in her stomach about 1415,allover, but more at the top.  Not feeling as much movement. Had a dizzy spell while in the shower, had to get out and lay on the floor.

## 2016-10-31 ENCOUNTER — Telehealth (HOSPITAL_COMMUNITY): Payer: Self-pay | Admitting: *Deleted

## 2016-10-31 ENCOUNTER — Encounter (HOSPITAL_COMMUNITY): Payer: Self-pay | Admitting: *Deleted

## 2016-10-31 LAB — OB RESULTS CONSOLE GBS: GBS: NEGATIVE

## 2016-10-31 NOTE — Telephone Encounter (Signed)
Preadmission screen  

## 2016-11-05 ENCOUNTER — Encounter (HOSPITAL_COMMUNITY): Payer: Self-pay

## 2016-11-05 ENCOUNTER — Observation Stay (HOSPITAL_COMMUNITY)
Admission: RE | Admit: 2016-11-05 | Discharge: 2016-11-05 | Disposition: A | Discharge status: Home / Self Care | Payer: Self-pay | Source: Ambulatory Visit | Attending: Certified Nurse Midwife | Admitting: Certified Nurse Midwife

## 2016-11-05 DIAGNOSIS — O328XX0 Maternal care for other malpresentation of fetus, not applicable or unspecified: Principal | ICD-10-CM | POA: Insufficient documentation

## 2016-11-05 DIAGNOSIS — O26893 Other specified pregnancy related conditions, third trimester: Secondary | ICD-10-CM

## 2016-11-05 DIAGNOSIS — O99343 Other mental disorders complicating pregnancy, third trimester: Secondary | ICD-10-CM | POA: Insufficient documentation

## 2016-11-05 DIAGNOSIS — Z3A37 37 weeks gestation of pregnancy: Secondary | ICD-10-CM | POA: Insufficient documentation

## 2016-11-05 DIAGNOSIS — O321XX0 Maternal care for breech presentation, not applicable or unspecified: Secondary | ICD-10-CM

## 2016-11-05 DIAGNOSIS — F329 Major depressive disorder, single episode, unspecified: Secondary | ICD-10-CM | POA: Insufficient documentation

## 2016-11-05 DIAGNOSIS — R109 Unspecified abdominal pain: Secondary | ICD-10-CM

## 2016-11-05 DIAGNOSIS — Z880 Allergy status to penicillin: Secondary | ICD-10-CM | POA: Insufficient documentation

## 2016-11-05 MED ORDER — LACTATED RINGERS IV SOLN: INTRAVENOUS | Status: DC

## 2016-11-05 MED ORDER — TERBUTALINE SULFATE 1 MG/ML IJ SOLN
0.2500 mg | Freq: Once | INTRAMUSCULAR | Status: AC
  Administered 2016-11-05: 0.25 mg via SUBCUTANEOUS
  Filled 2016-11-05: qty 1

## 2016-11-05 NOTE — Procedures (Signed)
Procedure explained. Bed side sono confirmed breech presentation. Consent signed. Terbutaline [place] given.  Attempt x 3 unsuccessful. Pt terminated. Tolerated procedure well. No decels noted with process

## 2016-11-05 NOTE — Discharge Instructions (Signed)

## 2016-11-05 NOTE — H&P (Signed)
  HISTORY & PHYSICAL:  Date: 11/05/2016  7:53 AM  Diagnosis: 37.0 weeks / footling breech   Susan Hines is a 26 y.o. female presenting for elective attempt at ECV.  Prenatal History: G2P1001   EDC : 11/26/2016, by Last Menstrual Period  Prenatal care at [facility]  Primary Ob Provider: Renae Fickle Prenatal course complicated by anxiety/depression /footling breech  Prenatal Labs: ABO, Rh: A/Positive/-- (10/04 0000) Antibody: Negative (10/04 0000) Rubella:   Immune RPR: Nonreactive (10/04 0000)  HBsAg: Negative (10/04 0000)  HIV: Non-reactive (10/04 0000)   Medical / Surgical History : Past medical history:  Past Medical History:  Diagnosis Date  . Anemia   . Anxiety   . Bacterial vaginosis   . Headache(784.0)   . Hx of varicella   . Infection   . IUGR (intrauterine growth restriction) 06/16/2013  . Migraines   . Paroxysmal tachycardia (HCC)   . SVD (spontaneous vaginal delivery) 06/18/2013  . Yeast cystitis      Past surgical history:  Past Surgical History:  Procedure Laterality Date  . LAPAROSCOPIC ABDOMINAL EXPLORATION    . WISDOM TOOTH EXTRACTION      Family History:  Family History  Problem Relation Age of Onset  . Heart disease Maternal Grandmother   . Heart disease Maternal Grandfather   . Cancer Paternal Grandmother      Social History:  reports that she has never smoked. She has never used smokeless tobacco. She reports that she does not drink alcohol or use drugs.   Allergies: Augmentin [amoxicillin-pot clavulanate]    Current Medications at time of admission:  Prior to Admission medications   Medication Sig Start Date End Date Taking? Authorizing Provider  Ascorbic Acid (VITAMIN C PO) Take 1 tablet by mouth at bedtime.    Historical Provider, MD  FLUoxetine (PROZAC) 20 MG capsule Take 20 mg by mouth daily.    Historical Provider, MD  IRON PO Take 1 tablet by mouth at bedtime.    Historical Provider, MD  lansoprazole (PREVACID)  30 MG capsule Take 30 mg by mouth at bedtime.    Historical Provider, MD  metoCLOPramide (REGLAN) 10 MG tablet Take 1 tablet (10 mg total) by mouth 3 (three) times daily before meals. Patient not taking: Reported on 09/26/2016 04/14/16   Duane Lope, NP  Prenatal Vit-Fe Fumarate-FA (PRENATAL MULTIVITAMIN) TABS tablet Take 1 tablet by mouth at bedtime.    Historical Provider, MD   Review of Systems: Active FM No ctx felt  Physical Exam: VS: Blood pressure 113/70, pulse 86, temperature 98 F (36.7 C), temperature source Oral, resp. rate 20, height 5' (1.524 m), weight 74.8 kg (165 lb), last menstrual period 02/20/2016, unknown if currently breastfeeding.  General: alert and oriented, appears mildly anxious but calm Heart: RRR Lungs: Clear lung fields Abdomen: Gravid, soft and non-tender, non-distended / uterus: gravid Extremities: no edema  SONO: head upper right quadrant / footling breech with subjectively low fluid / + cardiac activity / + gross body movements  Genitalia / VE:  deferred  FHR: baseline rate 130 / variability moderate / accelerations + / no decelerations TOCO: occasional ctx  Assessment: [redacted] weeks gestation FHR category 1 Footling breech - desires attempt at ECV  Plan:  ECV attempt Dr Cherly Hensen in room - consent signed after review of risks and benefits / aware not best candidate with footling breech  Marlinda Mike CNM, MSN, [facility] 11/05/2016, 9:34 AM

## 2016-11-06 NOTE — Discharge Summary (Signed)
Patient ID: Susan Hines MRN: [id number] DOB/AGE: 08-16-1990 26 y.o.  Admit date: 11/05/2016 Admission Diagnoses: breech at 37 weeks  Discharge date: 11/05/2016  Discharge Diagnoses: persistent breech presentation / failed ECV  Prenatal history: G2P1001   EDC : 11/26/2016, by Last Menstrual Period  Prenatal care at [facility]  Primary provider : Fredric Mare  Prenatal Labs: ABO, Rh: A/Positive/-- (10/04 0000) Antibody: Negative (10/04 0000) Rubella:   Immune RPR: Nonreactive (10/04 0000)  HBsAg: Negative (10/04 0000)  HIV: Non-reactive (10/04 0000)   Medical / Surgical History : Past medical history:  Past Medical History:  Diagnosis Date  . Anemia   . Anxiety   . Bacterial vaginosis   . Headache(784.0)   . Hx of varicella   . Infection   . IUGR (intrauterine growth restriction) 06/16/2013  . Migraines   . Paroxysmal tachycardia (HCC)   . SVD (spontaneous vaginal delivery) 06/18/2013  . Yeast cystitis     Past surgical history:  Past Surgical History:  Procedure Laterality Date  . LAPAROSCOPIC ABDOMINAL EXPLORATION    . WISDOM TOOTH EXTRACTION      Family History:  Family History  Problem Relation Age of Onset  . Heart disease Maternal Grandmother   . Heart disease Maternal Grandfather   . Cancer Paternal Grandmother     Social History:  reports that she has never smoked. She has never used smokeless tobacco. She reports that she does not drink alcohol or use drugs.  Allergies: Augmentin [amoxicillin-pot clavulanate]   Current Medications at time of admission:  Prior to Admission medications   Medication Sig Start Date End Date Taking? Authorizing Provider  Ascorbic Acid (VITAMIN C PO) Take 1 tablet by mouth at bedtime.    Historical Provider, MD  FLUoxetine (PROZAC) 20 MG capsule Take 20 mg by mouth daily.    Historical Provider, MD  IRON PO Take 1 tablet by mouth at bedtime.    Historical Provider, MD  lansoprazole (PREVACID) 30 MG capsule  Take 30 mg by mouth at bedtime.    Historical Provider, MD  metoCLOPramide (REGLAN) 10 MG tablet Take 1 tablet (10 mg total) by mouth 3 (three) times daily before meals. Patient not taking: Reported on 09/26/2016 04/14/16   Duane Lope, NP  Prenatal Vit-Fe Fumarate-FA (PRENATAL MULTIVITAMIN) TABS tablet Take 1 tablet by mouth at bedtime.    Historical Provider, MD    Discharge Instructions: Discharged Condition: stable  Activity: as tolerated  Diet: routine  Medications: PNV Allergies as of 11/05/2016      Reactions   Augmentin [amoxicillin-pot Clavulanate] Diarrhea, Itching   Has patient had a PCN reaction causing immediate rash, facial/tongue/throat swelling, SOB or lightheadedness with hypotension: no Has patient had a PCN reaction causing severe rash involving mucus membranes or skin necrosis: no Has patient had a PCN reaction that required hospitalization no Has patient had a PCN reaction occurring within the last 10 years: yes If all of the above answers are "NO", then may proceed with Cephalosporin use.      Medication List    ASK your doctor about these medications   FLUoxetine 20 MG capsule Commonly known as:  PROZAC Take 20 mg by mouth daily.   IRON PO Take 1 tablet by mouth at bedtime.   lansoprazole 30 MG capsule Commonly known as:  PREVACID Take 30 mg by mouth at bedtime.   metoCLOPramide 10 MG tablet Commonly known as:  REGLAN Take 1 tablet (10 mg total) by mouth 3 (three) times  daily before meals.   prenatal multivitamin Tabs tablet Take 1 tablet by mouth at bedtime.   VITAMIN C PO Take 1 tablet by mouth at bedtime.       Discharge Instructions: Preterm/ labor signs reviewed                                            Fetal kick counts                                             Discharge to: Home  Follow up : As scheduled at [facility] for next prenatal visit                     Planned CS at 39 weeks   Signed: Marlinda Mike, CNM, MSN,  [facility] 11/06/2016, 3:11 PM

## 2016-11-13 ENCOUNTER — Encounter (HOSPITAL_COMMUNITY): Payer: Self-pay

## 2016-11-14 ENCOUNTER — Other Ambulatory Visit: Payer: Self-pay | Admitting: Obstetrics & Gynecology

## 2016-11-19 ENCOUNTER — Encounter (HOSPITAL_COMMUNITY)
Admission: RE | Admit: 2016-11-19 | Discharge: 2016-11-19 | Disposition: A | Discharge status: Home / Self Care | Payer: Self-pay | Source: Ambulatory Visit | Attending: Obstetrics & Gynecology | Admitting: Obstetrics & Gynecology

## 2016-11-19 HISTORY — DX: Other specified postprocedural states: Z98.890

## 2016-11-19 HISTORY — DX: Nausea with vomiting, unspecified: R11.2

## 2016-11-19 HISTORY — DX: Vomiting, unspecified: R11.10

## 2016-11-19 LAB — TYPE AND SCREEN
ABO/RH(D): A POS
Antibody Screen: NEGATIVE

## 2016-11-19 LAB — CBC WITH DIFFERENTIAL/PLATELET
BASOS ABS: 0 10*3/uL (ref 0.0–0.1)
Basophils Relative: 0 %
EOS ABS: 0 10*3/uL (ref 0.0–0.7)
Eosinophils Relative: 0 %
HEMATOCRIT: 34.3 % — AB (ref 36.0–46.0)
Hemoglobin: 11.2 g/dL — ABNORMAL LOW (ref 12.0–15.0)
LYMPHS ABS: 2 10*3/uL (ref 0.7–4.0)
Lymphocytes Relative: 25 %
MCH: 26.7 pg (ref 26.0–34.0)
MCHC: 32.7 g/dL (ref 30.0–36.0)
MCV: 81.9 fL (ref 78.0–100.0)
Monocytes Absolute: 0.4 10*3/uL (ref 0.1–1.0)
Monocytes Relative: 5 %
NEUTROS PCT: 70 %
Neutro Abs: 5.7 10*3/uL (ref 1.7–7.7)
Platelets: 260 10*3/uL (ref 150–400)
RBC: 4.19 MIL/uL (ref 3.87–5.11)
RDW: 14.9 % (ref 11.5–15.5)
WBC: 8 10*3/uL (ref 4.0–10.5)

## 2016-11-19 LAB — ABO/RH: ABO/RH(D): A POS

## 2016-11-19 MED ORDER — GENTAMICIN SULFATE 40 MG/ML IJ SOLN
INTRAVENOUS | Status: DC
  Filled 2016-11-19: qty 7.25

## 2016-11-19 NOTE — Patient Instructions (Signed)
20 Deniece Rankin  11/19/2016   Your procedure is scheduled on: 11/20/2016  Enter through the Main Entrance of [facility] at 1145 AM.  Pick up the phone at the desk and dial [phone].   Call this number if you have problems the morning of surgery: [phone]   Remember:   Do not eat food:After Midnight.  Do not drink clear liquids: After Midnight.  Take these medicines the morning of surgery with A SIP OF WATER: none.  Call Dr Camillia Herter office for any additional medication questions   Do not wear jewelry, make-up or nail polish.  Do not wear lotions, powders, or perfumes. Do not wear deodorant.  Do not shave 48 hours prior to surgery.  Do not bring valuables to the hospital.  [facility] is not   responsible for any belongings or valuables brought to the hospital.  Contacts, dentures or bridgework may not be worn into surgery.  Leave suitcase in the car. After surgery it may be brought to your room.  For patients admitted to the hospital, checkout time is 11:00 AM the day of              discharge.   Patients discharged the day of surgery will not be allowed to drive             home.  Name and phone number of your driver: na  Special Instructions:   N/A   Please read over the following fact sheets that you were given:   Surgical Site Infection Prevention

## 2016-11-20 ENCOUNTER — Inpatient Hospital Stay (HOSPITAL_COMMUNITY)
Admission: AD | Admit: 2016-11-20 | Discharge: 2016-11-22 | DRG: 774 | Disposition: A | Discharge status: Home / Self Care | Payer: Self-pay | Source: Ambulatory Visit | Attending: Obstetrics & Gynecology | Admitting: Obstetrics & Gynecology

## 2016-11-20 ENCOUNTER — Encounter (HOSPITAL_COMMUNITY): Payer: Self-pay | Admitting: *Deleted

## 2016-11-20 ENCOUNTER — Encounter (HOSPITAL_COMMUNITY)
Admission: AD | Disposition: A | Discharge status: Home / Self Care | Payer: Self-pay | Source: Ambulatory Visit | Attending: Obstetrics & Gynecology

## 2016-11-20 DIAGNOSIS — F419 Anxiety disorder, unspecified: Secondary | ICD-10-CM | POA: Diagnosis present

## 2016-11-20 DIAGNOSIS — O321XX0 Maternal care for breech presentation, not applicable or unspecified: Principal | ICD-10-CM | POA: Diagnosis present

## 2016-11-20 DIAGNOSIS — O4423 Partial placenta previa NOS or without hemorrhage, third trimester: Secondary | ICD-10-CM | POA: Diagnosis present

## 2016-11-20 DIAGNOSIS — O99214 Obesity complicating childbirth: Secondary | ICD-10-CM | POA: Diagnosis present

## 2016-11-20 DIAGNOSIS — Z3A39 39 weeks gestation of pregnancy: Secondary | ICD-10-CM

## 2016-11-20 DIAGNOSIS — Z6832 Body mass index (BMI) 32.0-32.9, adult: Secondary | ICD-10-CM

## 2016-11-20 DIAGNOSIS — O2613 Low weight gain in pregnancy, third trimester: Secondary | ICD-10-CM | POA: Diagnosis present

## 2016-11-20 DIAGNOSIS — K219 Gastro-esophageal reflux disease without esophagitis: Secondary | ICD-10-CM | POA: Diagnosis present

## 2016-11-20 DIAGNOSIS — O9962 Diseases of the digestive system complicating childbirth: Secondary | ICD-10-CM | POA: Diagnosis present

## 2016-11-20 DIAGNOSIS — O9902 Anemia complicating childbirth: Secondary | ICD-10-CM | POA: Diagnosis not present

## 2016-11-20 DIAGNOSIS — O99344 Other mental disorders complicating childbirth: Secondary | ICD-10-CM | POA: Diagnosis present

## 2016-11-20 DIAGNOSIS — O320XX0 Maternal care for unstable lie, not applicable or unspecified: Secondary | ICD-10-CM | POA: Diagnosis present

## 2016-11-20 DIAGNOSIS — E669 Obesity, unspecified: Secondary | ICD-10-CM | POA: Diagnosis present

## 2016-11-20 DIAGNOSIS — Z8249 Family history of ischemic heart disease and other diseases of the circulatory system: Secondary | ICD-10-CM

## 2016-11-20 LAB — RPR: RPR Ser Ql: NONREACTIVE

## 2016-11-20 SURGERY — Surgical Case
Anesthesia: Epidural | Wound class: Clean Contaminated

## 2016-11-20 MED ORDER — OXYCODONE-ACETAMINOPHEN 5-325 MG PO TABS: 1.0000 | ORAL_TABLET | ORAL | Status: DC | PRN

## 2016-11-20 MED ORDER — LACTATED RINGERS IV SOLN
INTRAVENOUS | Status: DC
  Administered 2016-11-20: 13:00:00 via INTRAVENOUS

## 2016-11-20 MED ORDER — OXYTOCIN BOLUS FROM INFUSION
500.0000 mL | Freq: Once | INTRAVENOUS | Status: AC
  Administered 2016-11-21: 500 mL via INTRAVENOUS

## 2016-11-20 MED ORDER — TERBUTALINE SULFATE 1 MG/ML IJ SOLN: 0.2500 mg | Freq: Once | INTRAMUSCULAR | Status: DC | PRN

## 2016-11-20 MED ORDER — LACTATED RINGERS IV SOLN: 500.0000 mL | INTRAVENOUS | Status: DC | PRN

## 2016-11-20 MED ORDER — LIDOCAINE HCL (PF) 1 % IJ SOLN
30.0000 mL | INTRAMUSCULAR | Status: AC | PRN
  Administered 2016-11-21: 6 mL via SUBCUTANEOUS
  Administered 2016-11-21: 4 mL via SUBCUTANEOUS

## 2016-11-20 MED ORDER — SOD CITRATE-CITRIC ACID 500-334 MG/5ML PO SOLN: 30.0000 mL | ORAL | Status: DC | PRN

## 2016-11-20 MED ORDER — ACETAMINOPHEN 325 MG PO TABS: 650.0000 mg | ORAL_TABLET | ORAL | Status: DC | PRN

## 2016-11-20 MED ORDER — SCOPOLAMINE 1 MG/3DAYS TD PT72
1.0000 | MEDICATED_PATCH | Freq: Once | TRANSDERMAL | Status: DC
  Filled 2016-11-20: qty 1

## 2016-11-20 MED ORDER — FAMOTIDINE 20 MG PO TABS
20.0000 mg | ORAL_TABLET | Freq: Once | ORAL | Status: AC
  Administered 2016-11-20: 20 mg via ORAL
  Filled 2016-11-20: qty 1

## 2016-11-20 MED ORDER — OXYTOCIN 40 UNITS IN LACTATED RINGERS INFUSION - SIMPLE MED: 2.5000 [IU]/h | INTRAVENOUS | Status: DC

## 2016-11-20 MED ORDER — OXYTOCIN 40 UNITS IN LACTATED RINGERS INFUSION - SIMPLE MED
1.0000 m[IU]/min | INTRAVENOUS | Status: DC
  Administered 2016-11-20: 2 m[IU]/min via INTRAVENOUS
  Filled 2016-11-20: qty 1000

## 2016-11-20 MED ORDER — SOD CITRATE-CITRIC ACID 500-334 MG/5ML PO SOLN
30.0000 mL | Freq: Once | ORAL | Status: DC
  Filled 2016-11-20: qty 15

## 2016-11-20 NOTE — Anesthesia Preprocedure Evaluation (Addendum)
Anesthesia Evaluation  Patient identified by MRN, date of birth, ID band Patient awake    Reviewed: Allergy & Precautions, H&P , NPO status , Patient's Chart, lab work & pertinent test results  History of Anesthesia Complications (+) PONV and history of anesthetic complications  Airway Mallampati: II  TM Distance: >3 FB Neck ROM: Full    Dental  (+) Teeth Intact, Dental Advisory Given   Pulmonary neg pulmonary ROS,    Pulmonary exam normal breath sounds clear to auscultation       Cardiovascular Exercise Tolerance: Good Normal cardiovascular exam Rhythm:Regular Rate:Normal  Paroxysmal tachycardia  Echo 08/2012: Study Conclusions  Left ventricle: The cavity size was normal. Wall thickness was normal. Systolic function was normal. The estimated ejection fraction was in the range of 55% to 60%. Wall motion was normal; there were no regional wall motion abnormalities. Left ventricular diastolic function parameters were normal.      Neuro/Psych  Headaches, PSYCHIATRIC DISORDERS Anxiety    GI/Hepatic Neg liver ROS, GERD  Medicated,  Endo/Other  Obesity   Renal/GU negative Renal ROS     Musculoskeletal negative musculoskeletal ROS (+)   Abdominal   Peds  Hematology  (+) Blood dyscrasia, anemia , Plt 260k   Anesthesia Other Findings Day of surgery medications reviewed with the patient.  Reproductive/Obstetrics (+) Pregnancy                            Anesthesia Physical Anesthesia Plan  ASA: II  Anesthesia Plan: Epidural   Post-op Pain Management:    Induction:   Airway Management Planned: Simple Face Mask  Additional Equipment:   Intra-op Plan:   Post-operative Plan:   Informed Consent: I have reviewed the patients History and Physical, chart, labs and discussed the procedure including the risks, benefits and alternatives for the proposed anesthesia with the patient or  authorized representative who has indicated his/her understanding and acceptance.   Dental advisory given  Plan Discussed with:   Anesthesia Plan Comments: (Epidural anesthesia for C-section due to breech presentation.)        Anesthesia Quick Evaluation

## 2016-11-20 NOTE — Progress Notes (Signed)
Discussion with patient and family - expectant management with DC home to await labor versus admission with induction of labor Patient concern for baby turning again if goes home and prefers option to stay for induction Feeling some ctx - nothing regular or painful Cervix: soft / mid-position / 3cm / 70% vtx -3  Discussed induction options: cervical balloon, cytotec, pitocin, AROM - would not benefit from cervical balloon and cervix is ripened and dilated to 3cm                                                 Pitocin most efficient method of induction but could opt for Cytotec if not having regular ctx                                                 AROM for fetal descent and facilitate active labor  Discussed pain management options: hydrotherapy in shower only (did not take water birth class and not candidate for water birth) / IV analgesia / Nitrous oxide / epidural   Plan: wants to proceed with IOL with pitocin and AROM - desires to eat light lunch first since NPO since midnight           eat light lunch           transfer to [facility] for induction of labor  Marlinda Mike CNM [facility]

## 2016-11-20 NOTE — Progress Notes (Signed)
S:  Patient comfortable during contraction- rating pain 2 out of 10  O:  VS: Blood pressure 113/75, pulse 95, temperature 98.5 F (36.9 C), temperature source Oral, resp. rate 18, height 5' (1.524 m), weight 75.3 kg (166 lb), last menstrual period 02/20/2016, SpO2 100 %,         FHR : baseline 120 / variability moderate / accelerations present / no decelerations        Toco: contractions every 2-3 minutes / mild-mod        Cervix : Dilation: 4 Effacement (%): 80 Station: -2 Presentation: Vertex Exam by:: Lanice Shirts SNM         Membranes: AROM @ 2242, clear fluid   A: Early labor- slow progress      FHR category 1  P: Continue titration with pitocin - IV infiltrated needed to restart       Recheck progress in 2 hours       Expectant management      Steward Drone BSN, SNM 11/20/2016, 10:50 PM

## 2016-11-20 NOTE — Anesthesia Pain Management Evaluation Note (Signed)
  CRNA Pain Management Visit Note  Patient: Susan Hines, 26 y.o., female  "Hello I am a member of the anesthesia team at [facility]. We have an anesthesia team available at all times to provide care throughout the hospital, including epidural management and anesthesia for C-section. I don't know your plan for the delivery whether it a natural birth, water birth, IV sedation, nitrous supplementation, doula or epidural, but we want to meet your pain goals."   1.Was your pain managed to your expectations on prior hospitalizations?   No prior hospitalizations  2.What is your expectation for pain management during this hospitalization?     Nitrous Oxide  3.How can we help you reach that goal? unsure  Record the patient's initial score and the patient's pain goal.   Pain: 0  Pain Goal: 7 [facility] wants you to be able to say your pain was always managed very well.  Cephus Shelling 11/20/2016

## 2016-11-20 NOTE — H&P (Signed)
Susan Hines is a 26 y.o. female presenting for primary C-section 39.1 wks for Breech, failed external cephalic version. Good FMs. No LOF/ vag bleeding/ contractions Prior SVD.  MedHx reviewed.  Pregnancy complicated by hyperemesis and wt loss in 1st trim, low weight gain in pregnancy (10-11 lbs). GERD. Anxiety.   OB History    Gravida Para Term Preterm AB Living   SAB TAB Ectopic Multiple Live Births           1     Past Medical History:  Diagnosis Date  . Anemia   . Anxiety   . Bacterial vaginosis   . Headache(784.0)   . Hx of varicella   . Hyperemesis   . Infection   . IUGR (intrauterine growth restriction) 06/16/2013  . Migraines   . Paroxysmal tachycardia (HCC)   . PONV (postoperative nausea and vomiting)   . SVD (spontaneous vaginal delivery) 06/18/2013  . Yeast cystitis    Past Surgical History:  Procedure Laterality Date  . LAPAROSCOPIC ABDOMINAL EXPLORATION    . WISDOM TOOTH EXTRACTION     Family History: family history includes Cancer in her paternal grandmother; Heart disease in her maternal grandfather and maternal grandmother. Social History:  reports that she has never smoked. She has never used smokeless tobacco. She reports that she does not drink alcohol or use drugs.     Maternal Diabetes: No Genetic Screening: Normal QUAD Maternal Ultrasounds/Referrals: Normal Fetal Ultrasounds or other Referrals:  None Maternal Substance Abuse:  No Significant Maternal Medications:  Meds include: Other:   Prevacid, Fluoxetine Significant Maternal Lab Results:  None Other Comments:  None  ROS neg  History   Last menstrual period 02/20/2016, unknown if currently breastfeeding. Exam Physical Exam   Prenatal labs: ABO, Rh: --/--/A POS, A POS (04/25 1130) Antibody: NEG (04/25 1130) Rubella:  Immune  RPR: Non Reactive (04/25 1130)  HBsAg: Negative (10/04 0000)  HIV: Non-reactive (10/04 0000)  GBS:   Negative   Assessment/Plan: 26 yo  G2P1001, 39.1 wks. Bedside sono - Baby in cephalic presentation. Will cancel surgery.  Pt and CNM to decide discharge and await labor V/s labor induction.   Aeon Kessner R 11/20/2016, 1.30 pm

## 2016-11-21 ENCOUNTER — Inpatient Hospital Stay (HOSPITAL_COMMUNITY): Payer: Self-pay | Admitting: Certified Registered Nurse Anesthetist

## 2016-11-21 LAB — CBC
HCT: 33.8 % — ABNORMAL LOW (ref 36.0–46.0)
HEMOGLOBIN: 11 g/dL — AB (ref 12.0–15.0)
MCH: 26.4 pg (ref 26.0–34.0)
MCHC: 32.5 g/dL (ref 30.0–36.0)
MCV: 81.1 fL (ref 78.0–100.0)
PLATELETS: 278 10*3/uL (ref 150–400)
RBC: 4.17 MIL/uL (ref 3.87–5.11)
RDW: 15 % (ref 11.5–15.5)
WBC: 13.9 10*3/uL — ABNORMAL HIGH (ref 4.0–10.5)

## 2016-11-21 MED ORDER — ACETAMINOPHEN 325 MG PO TABS: 650.0000 mg | ORAL_TABLET | ORAL | Status: DC | PRN

## 2016-11-21 MED ORDER — LIDOCAINE HCL (PF) 1 % IJ SOLN
30.0000 mL | Freq: Once | INTRAMUSCULAR | Status: AC
  Administered 2016-11-21: 30 mL via SUBCUTANEOUS

## 2016-11-21 MED ORDER — SIMETHICONE 80 MG PO CHEW: 80.0000 mg | CHEWABLE_TABLET | ORAL | Status: DC | PRN

## 2016-11-21 MED ORDER — PHENYLEPHRINE 40 MCG/ML (10ML) SYRINGE FOR IV PUSH (FOR BLOOD PRESSURE SUPPORT)
PREFILLED_SYRINGE | INTRAVENOUS | Status: AC
  Filled 2016-11-21: qty 10

## 2016-11-21 MED ORDER — IBUPROFEN 600 MG PO TABS
600.0000 mg | ORAL_TABLET | Freq: Four times a day (QID) | ORAL | Status: DC
  Administered 2016-11-21 – 2016-11-22 (×6): 600 mg via ORAL
  Filled 2016-11-21 (×6): qty 1

## 2016-11-21 MED ORDER — FLUOXETINE HCL 20 MG PO CAPS
20.0000 mg | ORAL_CAPSULE | Freq: Every day | ORAL | Status: DC
  Administered 2016-11-21: 20 mg via ORAL
  Filled 2016-11-21 (×2): qty 1

## 2016-11-21 MED ORDER — DIBUCAINE 1 % RE OINT: 1.0000 "application " | TOPICAL_OINTMENT | RECTAL | Status: DC | PRN

## 2016-11-21 MED ORDER — EPHEDRINE 5 MG/ML INJ: 10.0000 mg | INTRAVENOUS | Status: DC | PRN

## 2016-11-21 MED ORDER — MEASLES, MUMPS & RUBELLA VAC ~~LOC~~ INJ
0.5000 mL | INJECTION | Freq: Once | SUBCUTANEOUS | Status: DC
  Filled 2016-11-21: qty 0.5

## 2016-11-21 MED ORDER — FENTANYL 2.5 MCG/ML BUPIVACAINE 1/10 % EPIDURAL INFUSION (WH - ANES)
INTRAMUSCULAR | Status: AC
  Filled 2016-11-21: qty 100

## 2016-11-21 MED ORDER — SENNOSIDES-DOCUSATE SODIUM 8.6-50 MG PO TABS
2.0000 | ORAL_TABLET | ORAL | Status: DC
  Administered 2016-11-21: 2 via ORAL
  Filled 2016-11-21: qty 2

## 2016-11-21 MED ORDER — DIPHENHYDRAMINE HCL 50 MG/ML IJ SOLN: 12.5000 mg | INTRAMUSCULAR | Status: DC | PRN

## 2016-11-21 MED ORDER — PHENYLEPHRINE 40 MCG/ML (10ML) SYRINGE FOR IV PUSH (FOR BLOOD PRESSURE SUPPORT): 80.0000 ug | PREFILLED_SYRINGE | INTRAVENOUS | Status: DC | PRN

## 2016-11-21 MED ORDER — OXYTOCIN 40 UNITS IN LACTATED RINGERS INFUSION - SIMPLE MED: 2.5000 [IU]/h | INTRAVENOUS | Status: DC | PRN

## 2016-11-21 MED ORDER — TETANUS-DIPHTH-ACELL PERTUSSIS 5-2.5-18.5 LF-MCG/0.5 IM SUSP: 0.5000 mL | Freq: Once | INTRAMUSCULAR | Status: DC

## 2016-11-21 MED ORDER — PRENATAL MULTIVITAMIN CH
1.0000 | ORAL_TABLET | Freq: Every day | ORAL | Status: DC
  Administered 2016-11-21 – 2016-11-22 (×2): 1 via ORAL
  Filled 2016-11-21 (×2): qty 1

## 2016-11-21 MED ORDER — PANTOPRAZOLE SODIUM 40 MG PO TBEC
40.0000 mg | DELAYED_RELEASE_TABLET | Freq: Every day | ORAL | Status: DC
  Administered 2016-11-21: 40 mg via ORAL
  Filled 2016-11-21: qty 1

## 2016-11-21 MED ORDER — DIPHENHYDRAMINE HCL 25 MG PO CAPS: 25.0000 mg | ORAL_CAPSULE | Freq: Four times a day (QID) | ORAL | Status: DC | PRN

## 2016-11-21 MED ORDER — WITCH HAZEL-GLYCERIN EX PADS: 1.0000 "application " | MEDICATED_PAD | CUTANEOUS | Status: DC | PRN

## 2016-11-21 MED ORDER — ONDANSETRON HCL 4 MG/2ML IJ SOLN
4.0000 mg | INTRAMUSCULAR | Status: DC | PRN
Start: 2016-11-21 — End: 2016-11-22

## 2016-11-21 MED ORDER — METHYLERGONOVINE MALEATE 0.2 MG PO TABS: 0.2000 mg | ORAL_TABLET | ORAL | Status: DC | PRN

## 2016-11-21 MED ORDER — ZOLPIDEM TARTRATE 5 MG PO TABS: 5.0000 mg | ORAL_TABLET | Freq: Every evening | ORAL | Status: DC | PRN

## 2016-11-21 MED ORDER — COCONUT OIL OIL
1.0000 | TOPICAL_OIL | Status: DC | PRN
Start: 2016-11-21 — End: 2016-11-22

## 2016-11-21 MED ORDER — FENTANYL 2.5 MCG/ML BUPIVACAINE 1/10 % EPIDURAL INFUSION (WH - ANES)
14.0000 mL/h | INTRAMUSCULAR | Status: DC | PRN
  Administered 2016-11-21: 14 mL/h via EPIDURAL

## 2016-11-21 MED ORDER — BENZOCAINE-MENTHOL 20-0.5 % EX AERO
1.0000 "application " | INHALATION_SPRAY | CUTANEOUS | Status: DC | PRN
  Administered 2016-11-21: 1 via TOPICAL
  Filled 2016-11-21: qty 56

## 2016-11-21 MED ORDER — MISOPROSTOL 200 MCG PO TABS
200.0000 ug | ORAL_TABLET | Freq: Once | ORAL | Status: AC
  Administered 2016-11-21: 200 ug via BUCCAL

## 2016-11-21 MED ORDER — LACTATED RINGERS IV SOLN: 500.0000 mL | Freq: Once | INTRAVENOUS | Status: DC

## 2016-11-21 MED ORDER — METHYLERGONOVINE MALEATE 0.2 MG PO TABS
0.2000 mg | ORAL_TABLET | Freq: Once | ORAL | Status: AC
  Administered 2016-11-21: 0.2 mg via ORAL
  Filled 2016-11-21: qty 1

## 2016-11-21 MED ORDER — OXYCODONE HCL 5 MG PO TABS: 5.0000 mg | ORAL_TABLET | ORAL | Status: DC | PRN

## 2016-11-21 MED ORDER — METHYLERGONOVINE MALEATE 0.2 MG/ML IJ SOLN: 0.2000 mg | INTRAMUSCULAR | Status: DC | PRN

## 2016-11-21 MED ORDER — MISOPROSTOL 200 MCG PO TABS
ORAL_TABLET | ORAL | Status: AC
  Administered 2016-11-21: 200 ug via BUCCAL
  Filled 2016-11-21: qty 4

## 2016-11-21 MED ORDER — MISOPROSTOL 200 MCG PO TABS
600.0000 ug | ORAL_TABLET | Freq: Once | ORAL | Status: AC
  Administered 2016-11-21: 600 ug via RECTAL

## 2016-11-21 MED ORDER — ONDANSETRON HCL 4 MG PO TABS: 4.0000 mg | ORAL_TABLET | ORAL | Status: DC | PRN

## 2016-11-21 NOTE — Progress Notes (Signed)
UR chart review completed.  

## 2016-11-21 NOTE — Anesthesia Postprocedure Evaluation (Signed)
Anesthesia Post Note  Patient: Susan Hines  Procedure(s) Performed: * No procedures listed *  Patient location during evaluation: Mother Baby Anesthesia Type: Epidural Level of consciousness: awake Pain management: satisfactory to patient Vital Signs Assessment: post-procedure vital signs reviewed and stable Respiratory status: spontaneous breathing Cardiovascular status: stable Anesthetic complications: no        Last Vitals:  Vitals:   11/21/16 0539 11/21/16 0640  BP: 101/72 111/71  Pulse: (!) 108 (!) 117  Resp: 19 17  Temp: 37.1 C 36.8 C    Last Pain:  Vitals:   11/21/16 0640  TempSrc: Oral  PainSc: 0-No pain   Pain Goal:                 [employer]

## 2016-11-21 NOTE — Progress Notes (Signed)
Post Partum Day 0, SVD at 3.03 am today Subjective: no complaints, up ad lib, voiding and tolerating PO Some perineal soreness. Question about "abruption" mention at birth, Nothing specific in note, possible terminal abruption, didn't affect FHTs.   Objective: Blood pressure 111/71, pulse (!) 117, temperature 98.2 F (36.8 C), temperature source Oral, resp. rate 17, height 5' (1.524 m), weight 166 lb (75.3 kg), last menstrual period 02/20/2016, SpO2 100 %, unknown if currently breastfeeding.  Physical Exam:  General: alert and cooperative Lochia: appropriate Uterine Fundus: firm Incision: defer checking today but no atypical complaints DVT Evaluation: No evidence of DVT seen on physical exam.  A(+) Rub Imm  Assessment/Plan: PPD #0, SVD, BOY, 6'7", with mother.  Routine PP care H/o PPH'hage and anemia in past, CBC tomo AM, if stable anticipate D/c tomorrow   Reviewed Circ pros/cons. Does NOT want infant circ'ed. Will discuss with Peds care of uncircumcised infant.    LOS: 1 day   Kieana Livesay R 11/21/2016, 12:37 PM

## 2016-11-21 NOTE — Progress Notes (Signed)
S: Patient comfortable after epidural placement   O: Dilation: 9 Effacement (%): 100 Cervical Position: Anterior Station: 0 Presentation: Vertex Exam by:: Steward Drone, SCNM  A: Active Labor- progressing well      FHR Cat 1- baseline 140/ mod/ accel present/ early deceleration   P: Expectant management - SVD

## 2016-11-21 NOTE — Lactation Note (Signed)
This note was copied from a baby's chart. Lactation Consultation Note  Patient Name: Susan Hines [id number] Date: 11/21/2016 Reason for consult: Initial assessment Breastfeeding consultation services and support information given and reviewed.  This is mom's second baby and newborn is 18 hours old.  Mom states baby latched soon after birth and has been nursing every 1-2 hours.  Observed baby latch easily and nurse actively.  Baby does pull off some and relatches himself.  Mom did not have a good experience with first baby and went to pumping and bottle feeding.  Instructed to feed with any cue and use good breast massage during feeding.  Encouraged to call for concerns/assist.  Maternal Data Has patient been taught Hand Expression?: Yes Does the patient have breastfeeding experience prior to this delivery?: Yes  Feeding Feeding Type: Breast Fed  LATCH Score/Interventions Latch: Grasps breast easily, tongue down, lips flanged, rhythmical sucking.  Audible Swallowing: A few with stimulation Intervention(s): Skin to skin;Hand expression;Alternate breast massage  Type of Nipple: Everted at rest and after stimulation  Comfort (Breast/Nipple): Soft / non-tender     Hold (Positioning): No assistance needed to correctly position infant at breast. Intervention(s): Breastfeeding basics reviewed;Support Pillows;Position options;Skin to skin  LATCH Score: 9  Lactation Tools Discussed/Used     Consult Status Consult Status: Follow-up Date: 11/22/16 Follow-up type: In-patient    Huston Foley 11/21/2016, 3:11 PM

## 2016-11-21 NOTE — Anesthesia Procedure Notes (Signed)
Epidural Patient location during procedure: OB  Staffing Anesthesiologist: Annielee Jemmott  Preanesthetic Cristela BlueCompleted: patient identified, site marked, surgical consent, pre-op evaluation, timeout performed, IV checked, risks and benefits discussed and monitors and equipment checked  Epidural Patient position: sitting Prep: site prepped and draped and DuraPrep Patient monitoring: continuous pulse ox and blood pressure Approach: midline Injection technique: LOR air  Needle:  Needle type: Tuohy  Needle gauge: 17 G Needle length: 9 cm and 9 Needle insertion depth: 6 cm Catheter type: closed end flexible Catheter size: 19 Gauge Catheter at skin depth: 12 cm Test dose: negative  Assessment Events: blood not aspirated, injection not painful, no injection resistance, negative IV test and no paresthesia  Additional Notes Dosing of Epidural:  1st dose, through catheter ............................................Marland Kitchen  Xylocaine 40 mg  2nd dose, through catheter, after waiting 3 minutes........Marland KitchenXylocaine 60 mg    As each dose occurred, patient was free of IV sx; and patient exhibited no evidence of SA injection.  Patient is more comfortable after epidural dosed. Please see RN's note for documentation of vital signs,and FHR which are stable.  Patient reminded not to try to ambulate with numb legs, and that an RN must be present when she attempts to get up.

## 2016-11-22 ENCOUNTER — Encounter (HOSPITAL_COMMUNITY): Payer: Self-pay

## 2016-11-22 DIAGNOSIS — O9902 Anemia complicating childbirth: Secondary | ICD-10-CM | POA: Diagnosis not present

## 2016-11-22 DIAGNOSIS — F419 Anxiety disorder, unspecified: Secondary | ICD-10-CM | POA: Diagnosis present

## 2016-11-22 LAB — CBC
HCT: 23.7 % — ABNORMAL LOW (ref 36.0–46.0)
Hemoglobin: 7.8 g/dL — ABNORMAL LOW (ref 12.0–15.0)
MCH: 27.3 pg (ref 26.0–34.0)
MCHC: 32.9 g/dL (ref 30.0–36.0)
MCV: 82.9 fL (ref 78.0–100.0)
Platelets: 177 10*3/uL (ref 150–400)
RBC: 2.86 MIL/uL — AB (ref 3.87–5.11)
RDW: 15.1 % (ref 11.5–15.5)
WBC: 9.3 10*3/uL (ref 4.0–10.5)

## 2016-11-22 MED ORDER — MAGNESIUM OXIDE 400 (241.3 MG) MG PO TABS: 400.0000 mg | ORAL_TABLET | Freq: Every day | ORAL | Status: DC

## 2016-11-22 MED ORDER — BENZOCAINE-MENTHOL 20-0.5 % EX AERO: 1.0000 "application " | INHALATION_SPRAY | CUTANEOUS | Status: DC | PRN

## 2016-11-22 MED ORDER — COCONUT OIL OIL: 1.0000 "application " | TOPICAL_OIL | 0 refills | Status: DC | PRN

## 2016-11-22 MED ORDER — FERREX 150 150 MG PO CAPS: 150.0000 mg | ORAL_CAPSULE | Freq: Two times a day (BID) | ORAL | 2 refills | Status: DC

## 2016-11-22 MED ORDER — ALFALFA 500 MG PO TABS: 2000.0000 mg | ORAL_TABLET | Freq: Every day | ORAL | 0 refills | Status: DC

## 2016-11-22 MED ORDER — IBUPROFEN 600 MG PO TABS: 600.0000 mg | ORAL_TABLET | Freq: Four times a day (QID) | ORAL | 0 refills | Status: AC

## 2016-11-22 NOTE — Discharge Instructions (Signed)
Iron-Rich Diet ° °Iron is a mineral that helps your body to produce hemoglobin. Hemoglobin is a protein in your red blood cells that carries oxygen to your body's tissues. Eating too little iron may cause you to feel weak and tired, and it can increase your risk for infection. Eating enough iron is necessary for your body's metabolism, muscle function, and nervous system. °Iron is naturally found in many foods. It can also be added to foods or fortified in foods. There are two types of dietary iron: °· Heme iron. Heme iron is absorbed by the body more easily than nonheme iron. Heme iron is found in meat, poultry, and fish. °· Nonheme iron. Nonheme iron is found in dietary supplements, iron-fortified grains, beans, and vegetables. °You may need to follow an iron-rich diet if: °· You have been diagnosed with iron deficiency or iron-deficiency anemia. °· You have a condition that prevents you from absorbing dietary iron, such as: °¨ Infection in your intestines. °¨ Celiac disease. This involves long-lasting (chronic) inflammation of your intestines. °· You do not eat enough iron. °· You eat a diet that is high in foods that impair iron absorption. °· You have lost a lot of blood. °· You have heavy bleeding during your menstrual cycle. °· You are pregnant. °What is my plan? °Your health care provider may help you to determine how much iron you need per day based on your condition. Generally, when a person consumes sufficient amounts of iron in the diet, the following iron needs are met: °· Men. °¨ 14-18 years old: 11 mg per day. °¨ 19-50 years old: 8 mg per day. °· Women. °¨ 14-18 years old: 15 mg per day. °¨ 19-50 years old: 18 mg per day. °¨ Over 50 years old: 8 mg per day. °¨ Pregnant women: 27 mg per day. °¨ Breastfeeding women: 9 mg per day. °What do I need to know about an iron-rich diet? °· Eat fresh fruits and vegetables that are high in vitamin C along with foods that are high in iron. This will help increase  the amount of iron that your body absorbs from food, especially with foods containing nonheme iron. Foods that are high in vitamin C include oranges, peppers, tomatoes, and mango. °· Take iron supplements only as directed by your health care provider. Overdose of iron can be life-threatening. If you were prescribed iron supplements, take them with orange juice or a vitamin C supplement. °· Cook foods in pots and pans that are made from iron. °· Eat nonheme iron-containing foods alongside foods that are high in heme iron. This helps to improve your iron absorption. °· Certain foods and drinks contain compounds that impair iron absorption. Avoid eating these foods in the same meal as iron-rich foods or with iron supplements. These include: °¨ Coffee, black tea, and red wine. °¨ Milk, dairy products, and foods that are high in calcium. °¨ Beans, soybeans, and peas. °¨ Whole grains. °· When eating foods that contain both nonheme iron and compounds that impair iron absorption, follow these tips to absorb iron better. °¨ Soak beans overnight before cooking. °¨ Soak whole grains overnight and drain them before using. °¨ Ferment flours before baking, such as using yeast in bread dough. °What foods can I eat? °Grains  °Iron-fortified breakfast cereal. Iron-fortified whole-wheat bread. Enriched rice. Sprouted grains. °Vegetables  °Spinach. Potatoes with skin. Green peas. Broccoli. Red and green bell peppers. Fermented vegetables. °Fruits  °Prunes. Raisins. Oranges. Strawberries. Mango. Grapefruit. °Meats and Other Protein   Sources  °Beef liver. Oysters. Beef. Shrimp. Turkey. Chicken. Tuna. Sardines. Chickpeas. Nuts. Tofu. °Beverages  °Tomato juice. Fresh orange juice. Prune juice. Hibiscus tea. Fortified instant breakfast shakes. °Condiments  °Tahini. Fermented soy sauce. °Sweets and Desserts  °Black-strap molasses. °Other  °Wheat germ. °The items listed above may not be a complete list of recommended foods or beverages.  Contact your dietitian for more options.  °What foods are not recommended? °Grains  °Whole grains. Bran cereal. Bran flour. Oats. °Vegetables  °Artichokes. Brussels sprouts. Kale. °Fruits  °Blueberries. Raspberries. Strawberries. Figs. °Meats and Other Protein Sources  °Soybeans. Products made from soy protein. °Dairy  °Milk. Cream. Cheese. Yogurt. Cottage cheese. °Beverages  °Coffee. Black tea. Red wine. °Sweets and Desserts  °Cocoa. Chocolate. Ice cream. °Other  °Basil. Oregano. Parsley. °The items listed above may not be a complete list of foods and beverages to avoid. Contact your dietitian for more information.  °This information is not intended to replace advice given to you by your health care provider. Make sure you discuss any questions you have with your health care provider. °Document Released: 02/25/2005 Document Revised: 02/01/2016 Document Reviewed: 02/08/2014 °Elsevier Interactive Patient Education © 2017 Else[employer] Inc. ° ° ° °

## 2016-11-22 NOTE — Discharge Summary (Signed)
Obstetric Discharge Summary Reason for Admission: Scheduled cesarean section for breech with spontaneous version for induction of labor, unstable fetal lie Prenatal Procedures: ultrasound Intrapartum Procedures: spontaneous vaginal delivery and epidural Postpartum Procedures: none Complications-Operative and Postpartum: postpartum hemorrhage Hemoglobin  Date Value Ref Range Status  11/22/2016 7.8 (L) 12.0 - 15.0 g/dL Final    Comment:    REPEATED TO VERIFY DELTA CHECK NOTED    HCT  Date Value Ref Range Status  11/22/2016 23.7 (L) 36.0 - 46.0 % Final    Physical Exam:  General: alert, cooperative and no distress Lochia: appropriate Uterine Fundus: firm Incision: healing well DVT Evaluation: No cords or calf tenderness. No significant calf/ankle edema.  Discharge Diagnoses: Term Pregnancy-delivered and anemia, anxiety  Discharge Information: Date: 11/22/2016 Activity: pelvic rest Diet: routine Medications:  Allergies as of 11/22/2016      Reactions   Augmentin [amoxicillin-pot Clavulanate] Diarrhea, Itching   Has patient had a PCN reaction causing immediate rash, facial/tongue/throat swelling, SOB or lightheadedness with hypotension: no Has patient had a PCN reaction causing severe rash involving mucus membranes or skin necrosis: no Has patient had a PCN reaction that required hospitalization: no Has patient had a PCN reaction occurring within the last 10 years: yes If all of the above answers are "NO", then may proceed with Cephalosporin use.      Medication List    STOP taking these medications   metoCLOPramide 10 MG tablet Commonly known as:  REGLAN     TAKE these medications   Alfalfa 500 MG Tabs Take 4 tablets (2,000 mg total) by mouth daily.   benzocaine-Menthol 20-0.5 % Aero Commonly known as:  DERMOPLAST Apply 1 application topically as needed for irritation (perineal discomfort).   coconut oil Oil Apply 1 application topically as needed.   FERREX 150  150 MG capsule Generic drug:  iron polysaccharides Take 1 capsule (150 mg total) by mouth 2 (two) times daily. What changed:  when to take this   FLUoxetine 20 MG capsule Commonly known as:  PROZAC Take 20 mg by mouth daily.   ibuprofen 600 MG tablet Commonly known as:  ADVIL,MOTRIN Take 1 tablet (600 mg total) by mouth every 6 (six) hours.   lansoprazole 30 MG capsule Commonly known as:  PREVACID Take 30 mg by mouth at bedtime.   magnesium oxide 400 (241.3 Mg) MG tablet Commonly known as:  MAG-OX Take 1 tablet (400 mg total) by mouth daily.      Condition: stable Instructions: refer to practice specific booklet Discharge to: home Follow-up Information    Marlinda Mike, CNM. Schedule an appointment as soon as possible for a visit in 6 week(s).   Specialty:  Obstetrics and Gynecology Contact information: [address] [place] [address] [phone]           Newborn Data: Live born female Francesco Runner Birth Weight: 6 lb 7.5 oz (2935 g) APGAR: 8, 9  Home with mother.  Neta Mends 11/22/2016, 11:08 AM

## 2016-11-22 NOTE — Lactation Note (Signed)
This note was copied from a baby's chart. Lactation Consultation Note Mom wanting early d/c home. States BF going well, has sore nipples. Mom stated had blister to Lt. Nipple but popped. No bruising noted, has good normal color until after BF then slightly red, changes to normal color a few minutes after BF. Mom has flat very compressible nipples. Gave hand pump to pre-pump prior to latching to evert nipple. #21 flange. Gave shells to wear to assist in everting nipple.  Mom tried NS with her first child and wasn't a good experience. Doesn't want to use NS. Mom ended up pumping and bottle feeding for 11 weeks w/1st child.  Hand expression w/easy flow of clear colostrum noted.  Mom latched in football position, touching top lip to obtain wide flange. Mom c/o soreness while feeding. Demonstrated chin tug. Discussed props and comfort while feeding. Mom has boppy at home.  Baby very satisfied after feeding.  Discussed emptying breast, breast filling, supply and demand. Offered coconut oil, mom stated she had nipple butter. Gave mom bullet for hand expression.  Asked mom to call for another latch to bee seen before d/c home if going today. Patient Name: Boy Sondos Wolfman [id number] Date: 11/22/2016 Reason for consult: Follow-up assessment   Maternal Data    Feeding Feeding Type: Breast Fed Length of feed: 20 min  LATCH Score/Interventions Latch: Grasps breast easily, tongue down, lips flanged, rhythmical sucking. Intervention(s): Adjust position;Assist with latch;Breast massage;Breast compression  Audible Swallowing: Spontaneous and intermittent Intervention(s): Hand expression;Alternate breast massage  Type of Nipple: Flat Intervention(s): Hand pump;Shells  Comfort (Breast/Nipple): Filling, red/small blisters or bruises, mild/mod discomfort  Problem noted: Mild/Moderate discomfort Interventions (Mild/moderate discomfort): Pre-pump if needed;Hand massage;Hand expression  Hold  (Positioning): Assistance needed to correctly position infant at breast and maintain latch. Intervention(s): Breastfeeding basics reviewed;Support Pillows;Position options;Skin to skin  LATCH Score: 7  Lactation Tools Discussed/Used Tools: Shells;Pump;Flanges Flange Size: Other (comment) (21) Shell Type: Inverted Breast pump type: Manual Pump Review: Setup, frequency, and cleaning;Milk Storage Initiated by:: L> Everado Pillsbury RN IBCLC Date initiated:: 11/22/16   Consult Status Consult Status: Follow-up Date: 11/22/16    Charyl Dancer 11/22/2016, 11:45 AM

## 2016-11-23 ENCOUNTER — Ambulatory Visit: Payer: Self-pay

## 2016-11-23 NOTE — Lactation Note (Signed)
This note was copied from a baby's chart. Lactation Consultation Note: mom has baby latched to breast as I went into room. Agree with RN latch score. Mom reports breasts are feeling heavier this morning, Easily able to hand express transitional milk. Baby nursed for 5 min then going off to sleep. Would not latch again. Mom reports he cluster fed through the night. Reviewed engorgement prevention and treatment. Has Medela pump for home. Reports nipples are sore. Comfort gels given with instructions for use. Reports this baby is doing much better than her first. No questions at present. Reviewed our phone number, OP appointments and BFSG as resources for support after DC To call prn  Patient Name: Susan Hines [id number] Date: 11/23/2016 Reason for consult: Follow-up assessment   Maternal Data Formula Feeding for Exclusion: No Does the patient have breastfeeding experience prior to this delivery?: Yes  Feeding Feeding Type: Breast Fed Length of feed: 5 min  LATCH Score/Interventions Latch: Grasps breast easily, tongue down, lips flanged, rhythmical sucking. Intervention(s): Adjust position;Assist with latch  Audible Swallowing: A few with stimulation Intervention(s): Hand expression;Skin to skin  Type of Nipple: Everted at rest and after stimulation (short shaft) Intervention(s): Shells;Hand pump  Comfort (Breast/Nipple): Filling, red/small blisters or bruises, mild/mod discomfort  Problem noted: Mild/Moderate discomfort Interventions (Mild/moderate discomfort): Comfort gels;Hand expression  Hold (Positioning): Assistance needed to correctly position infant at breast and maintain latch.  LATCH Score: 8  Lactation Tools Discussed/Used     Consult Status Consult Status: Complete    Pamelia Hoit 11/23/2016, 9:05 AM

## 2016-11-25 NOTE — Addendum Note (Signed)
Addendum  created 11/25/16 1416 by Fabienne Bruns, RN   Anesthesia Procedure Navigator section edited

## 2018-06-16 ENCOUNTER — Ambulatory Visit: Payer: Self-pay | Admitting: Allergy and Immunology

## 2018-08-04 ENCOUNTER — Encounter: Payer: Self-pay | Admitting: Allergy and Immunology

## 2018-08-04 ENCOUNTER — Ambulatory Visit (INDEPENDENT_AMBULATORY_CARE_PROVIDER_SITE_OTHER): Payer: Self-pay | Admitting: Allergy and Immunology

## 2018-08-04 VITALS — BP 110/60 | HR 97 | Temp 98.6°F | Resp 16 | Ht 60.0 in | Wt 158.0 lb

## 2018-08-04 DIAGNOSIS — H60543 Acute eczematoid otitis externa, bilateral: Secondary | ICD-10-CM

## 2018-08-04 DIAGNOSIS — L500 Allergic urticaria: Secondary | ICD-10-CM

## 2018-08-04 DIAGNOSIS — H60549 Acute eczematoid otitis externa, unspecified ear: Secondary | ICD-10-CM | POA: Insufficient documentation

## 2018-08-04 MED ORDER — FLUOCINOLONE ACETONIDE 0.01 % OT OIL: 5.0000 [drp] | TOPICAL_OIL | Freq: Two times a day (BID) | OTIC | 0 refills | Status: AC

## 2018-08-04 MED ORDER — MONTELUKAST SODIUM 10 MG PO TABS: 10.0000 mg | ORAL_TABLET | Freq: Every day | ORAL | 5 refills | Status: DC

## 2018-08-04 MED ORDER — LEVOCETIRIZINE DIHYDROCHLORIDE 5 MG PO TABS: 5.0000 mg | ORAL_TABLET | Freq: Every day | ORAL | 5 refills | Status: DC | PRN

## 2018-08-04 NOTE — Patient Instructions (Addendum)
Recurrent urticaria Unclear etiology. Skin tests to select food allergens were negative today. NSAIDs and emotional stress commonly exacerbate urticaria but are not the underlying etiology in this case. Physical urticarias are negative by history (i.e. pressure-induced, temperature, vibration, solar, etc.). History and lesions are not consistent with urticaria pigmentosa so I am not suspicious for mastocytosis. There are no concomitant symptoms concerning for anaphylaxis or constitutional symptoms worrisome for an underlying malignancy. We will rule out other potential etiologies with labs. For symptom relief, patient is to take oral antihistamines as directed.  The following labs have been ordered: FCeRI antibody, anti-thyroglobulin antibody, thyroid peroxidase antibody, tryptase, CBC, CMP, ESR, ANA, and galactose-alpha-1,3-galactose IgE level.  The patient will be called with further recommendations after lab results have returned.  Instructions have been discussed and provided for H1/H2 receptor blockade with titration to find lowest effective dose.  A prescription has been provided for levocetirizine, 5 mg daily as needed.  A prescription has been provided for montelukast 10 mg daily at bedtime.  Should there be a significant increase or change in symptoms, a journal is to be kept recording any foods eaten, beverages consumed, medications taken within a 6 hour period prior to the onset of symptoms, as well as record activities being performed, and environmental conditions. For any symptoms concerning for anaphylaxis, 911 is to be called immediately.  Dermatitis of ear canal  A prescription has been provided for Dermotic 0.01% oil 5 drops per ear canal twice daily for 14 days.   When lab results have returned the patient will be called with further recommendations and follow up instructions.   Urticaria (Hives)  . Levocetirizine (Xyzal) 5 mg twice a day and famotidine (Pepcid) 20 mg twice  a day. If no symptoms for 7-14 days then decrease to. . Levocetirizine (Xyzal) 5 mg twice a day and famotidine (Pepcid) 20 mg once a day.  If no symptoms for 7-14 days then decrease to. . Levocetirizine (Xyzal) 5 mg twice a day.  If no symptoms for 7-14 days then decrease to. . Levocetirizine (Xyzal) 5 mg once a day.  May use Benadryl (diphenhydramine) as needed for breakthrough symptoms       If symptoms return, then step up dosage

## 2018-08-04 NOTE — Progress Notes (Signed)
New Patient Note  RE: Susan Hines MRN: [id number] DOB: August 19, 1990 Date of Office Visit: 08/04/2018  Referring provider: Garlan Fillers, MD Primary care provider: Garlan Fillers, MD  Chief Complaint: Urticaria   History of present illness: Susan Hines is a 28 y.o. female seen today in consultation requested by Addison Naegeli, MD.  Since November 2446, Susan Hines has experienced recurrent episodes of hives. The hives have appeared at different times over her entire body.  The lesions are described as erythematous, raised, and pruritic.  Individual hives last less than 24 hours without leaving residual pigmentation or bruising. She denies concomitant angioedema, cardiopulmonary symptoms and GI symptoms. She has not experienced unexpected weight loss, recurrent fevers or drenching night sweats. No specific medication, food, skin care product, detergent, soap, or other environmental triggers have been identified. The symptoms do not seem to correlate with NSAIDs use or emotional stress. She did not have symptoms consistent with a respiratory tract infection at the time of symptom onset. Susan Hines has tried to control symptoms with systemic steroids and OTC antihistamines which have offered moderate temporary relief. She has not been evaluated and treated in the emergency department for these symptoms. Skin biopsy has not been performed. Recently, she has been experiencing hives daily.   Susan Hines was found to have H. pylori in 2017.  She underwent triple therapy, however it was never confirmed that the H. pylori was successfully eradicated.  Due to severe gastroesophageal reflux and a family history of Barrett's esophagus, she will be undergoing EGD in the near future to assess the esophagus and confirm eradication of H. pylori. She also complains of itching and flaking in her ear canals.  This is been a problem for a few years.  She currently applies corticosteroid cream in her ear canals with some  relief.  Assessment and plan: Recurrent urticaria Unclear etiology. Skin tests to select food allergens were negative today. NSAIDs and emotional stress commonly exacerbate urticaria but are not the underlying etiology in this case. Physical urticarias are negative by history (i.e. pressure-induced, temperature, vibration, solar, etc.). History and lesions are not consistent with urticaria pigmentosa so I am not suspicious for mastocytosis. There are no concomitant symptoms concerning for anaphylaxis or constitutional symptoms worrisome for an underlying malignancy. We will rule out other potential etiologies with labs. For symptom relief, patient is to take oral antihistamines as directed.  The following labs have been ordered: FCeRI antibody, anti-thyroglobulin antibody, thyroid peroxidase antibody, tryptase, CBC, CMP, ESR, ANA, and galactose-alpha-1,3-galactose IgE level.  The patient will be called with further recommendations after lab results have returned.  Instructions have been discussed and provided for H1/H2 receptor blockade with titration to find lowest effective dose.  A prescription has been provided for levocetirizine, 5 mg daily as needed.  A prescription has been provided for montelukast 10 mg daily at bedtime.  Should there be a significant increase or change in symptoms, a journal is to be kept recording any foods eaten, beverages consumed, medications taken within a 6 hour period prior to the onset of symptoms, as well as record activities being performed, and environmental conditions. For any symptoms concerning for anaphylaxis, 911 is to be called immediately.  Dermatitis of ear canal  A prescription has been provided for Dermotic 0.01% oil 5 drops per ear canal twice daily for 14 days.   Meds ordered this encounter  Medications  . levocetirizine (XYZAL) 5 MG tablet    Sig: Take 1 tablet (5 mg total) by mouth  daily as needed.    Dispense:  30 tablet    Refill:  5    . montelukast (SINGULAIR) 10 MG tablet    Sig: Take 1 tablet (10 mg total) by mouth at bedtime.    Dispense:  30 tablet    Refill:  5  . Fluocinolone Acetonide (DERMOTIC) 0.01 % OIL    Sig: Place 5 drops in ear(s) 2 (two) times daily for 14 days.    Dispense:  20 mL    Refill:  0    Diagnostics: Environmental skin testing: Negative despite a positive histamine control. Food allergen skin testing: Negative despite a positive histamine control.    Physical examination: Blood pressure 110/60, pulse 97, temperature 98.6 F (37 C), temperature source Oral, resp. rate 16, height 5' (1.524 m), weight 158 lb (71.7 kg), SpO2 99 %, unknown if currently breastfeeding.  General: Alert, interactive, in no acute distress. HEENT: TMs pearly gray, mild erythema and flaking of the ear canals bilaterally. Neck: Supple without lymphadenopathy. Lungs: Clear to auscultation without wheezing, rhonchi or rales. CV: Normal S1, S2 without murmurs. Abdomen: Nondistended, nontender. Skin: Scattered erythematous urticarial type lesions primarily located upper extremities and anterior neck , nonvesicular. Extremities:  No clubbing, cyanosis or edema. Neuro:   Grossly intact.  Review of systems:  Review of systems negative except as noted in HPI / PMHx or noted below: Review of Systems  Constitutional: Negative.   HENT: Negative.   Eyes: Negative.   Respiratory: Negative.   Cardiovascular: Negative.   Gastrointestinal: Negative.   Genitourinary: Negative.   Musculoskeletal: Negative.   Skin: Negative.   Neurological: Negative.   Endo/Heme/Allergies: Negative.   Psychiatric/Behavioral: Negative.     Past medical history:  Past Medical History:  Diagnosis Date  . Anemia   . Anxiety   . Bacterial vaginosis   . H. pylori infection 06/2016  . Headache(784.0)   . Hx of varicella   . Hyperemesis   . Infection   . IUGR (intrauterine growth restriction) 06/16/2013  . Migraines   . Paroxysmal  tachycardia ([place])   . PONV (postoperative nausea and vomiting)   . SVD (spontaneous vaginal delivery) 06/18/2013  . Yeast cystitis     Past surgical history:  Past Surgical History:  Procedure Laterality Date  . CESAREAN SECTION  2014, 2018  . LAPAROSCOPIC ABDOMINAL EXPLORATION  2006  . WISDOM TOOTH EXTRACTION  2006    Family history: Family History  Problem Relation Age of Onset  . Heart disease Maternal Grandmother   . Heart disease Maternal Grandfather   . Cancer Paternal Grandmother   . Allergic rhinitis Sister   . Bronchitis Sister   . Eczema Son   . Eczema Daughter   . Asthma Daughter   . Urticaria Neg Hx   . Immunodeficiency Neg Hx   . Atopy Neg Hx   . Angioedema Neg Hx     Social history: Social History   Socioeconomic History  . Marital status: Married    Spouse name: Not on file  . Number of children: Not on file  . Years of education: Not on file  . Highest education level: Not on file  Occupational History  . Not on file  Social Needs  . Financial resource strain: Not on file  . Food insecurity:    Worry: Not on file    Inability: Not on file  . Transportation needs:    Medical: Not on file    Non-medical: Not on file  Tobacco  Use  . Smoking status: Never Smoker  . Smokeless tobacco: Never Used  Substance and Sexual Activity  . Alcohol use: Yes    Comment: occasional  . Drug use: No  . Sexual activity: Yes    Birth control/protection: None  Lifestyle  . Physical activity:    Days per week: Not on file    Minutes per session: Not on file  . Stress: Not on file  Relationships  . Social connections:    Talks on phone: Not on file    Gets together: Not on file    Attends religious service: Not on file    Active member of club or organization: Not on file    Attends meetings of clubs or organizations: Not on file    Relationship status: Not on file  . Intimate partner violence:    Fear of current or ex partner: Not on file     Emotionally abused: Not on file    Physically abused: Not on file    Forced sexual activity: Not on file  Other Topics Concern  . Not on file  Social History Narrative  . Not on file   Environmental History: The patient lives in a 30 year old house with carpeting the bedroom and central air/heat.  She is a non-smoker without pets.  There is no known mold/water damage in the home.  Allergies as of 08/04/2018      Reactions   Augmentin [amoxicillin-pot Clavulanate] Diarrhea, Itching   Has patient had a PCN reaction causing immediate rash, facial/tongue/throat swelling, SOB or lightheadedness with hypotension: no Has patient had a PCN reaction causing severe rash involving mucus membranes or skin necrosis: no Has patient had a PCN reaction that required hospitalization: no Has patient had a PCN reaction occurring within the last 10 years: yes If all of the above answers are "NO", then may proceed with Cephalosporin use.      Medication List       Accurate as of August 04, 2018  1:30 PM. Always use your most recent med list.        diphenhydrAMINE 12.5 MG/5ML elixir Commonly known as:  BENADRYL Take by mouth 4 (four) times daily as needed.   famotidine 20 MG tablet Commonly known as:  PEPCID Take 20 mg by mouth 2 (two) times daily.   Fluocinolone Acetonide 0.01 % Oil Commonly known as:  DERMOTIC Place 5 drops in ear(s) 2 (two) times daily for 14 days.   FLUoxetine 20 MG capsule Commonly known as:  PROZAC Take 20 mg by mouth daily.   fluticasone 0.05 % cream Commonly known as:  CUTIVATE Apply topically.   ibuprofen 600 MG tablet Commonly known as:  ADVIL,MOTRIN Take 1 tablet (600 mg total) by mouth every 6 (six) hours.   levocetirizine 5 MG tablet Commonly known as:  XYZAL Take 1 tablet (5 mg total) by mouth daily as needed.   loratadine 10 MG tablet Commonly known as:  CLARITIN Take 10 mg by mouth daily.   montelukast 10 MG tablet Commonly known as:   SINGULAIR Take 1 tablet (10 mg total) by mouth at bedtime.       Known medication allergies: Allergies  Allergen Reactions  . Augmentin [Amoxicillin-Pot Clavulanate] Diarrhea and Itching    Has patient had a PCN reaction causing immediate rash, facial/tongue/throat swelling, SOB or lightheadedness with hypotension: no Has patient had a PCN reaction causing severe rash involving mucus membranes or skin necrosis: no Has patient had a PCN reaction that  required hospitalization: no Has patient had a PCN reaction occurring within the last 10 years: yes If all of the above answers are "NO", then may proceed with Cephalosporin use.     I appreciate the opportunity to take part in Margaux's care. Please do not hesitate to contact me with questions.  Sincerely,   R. Edgar Frisk, MD

## 2018-08-04 NOTE — Assessment & Plan Note (Signed)
Unclear etiology. Skin tests to select food allergens were negative today. NSAIDs and emotional stress commonly exacerbate urticaria but are not the underlying etiology in this case. Physical urticarias are negative by history (i.e. pressure-induced, temperature, vibration, solar, etc.). History and lesions are not consistent with urticaria pigmentosa so I am not suspicious for mastocytosis. There are no concomitant symptoms concerning for anaphylaxis or constitutional symptoms worrisome for an underlying malignancy. We will rule out other potential etiologies with labs. For symptom relief, patient is to take oral antihistamines as directed.  The following labs have been ordered: FCeRI antibody, anti-thyroglobulin antibody, thyroid peroxidase antibody, tryptase, CBC, CMP, ESR, ANA, and galactose-alpha-1,3-galactose IgE level.  The patient will be called with further recommendations after lab results have returned.  Instructions have been discussed and provided for H1/H2 receptor blockade with titration to find lowest effective dose.  A prescription has been provided for levocetirizine, 5 mg daily as needed.  A prescription has been provided for montelukast 10 mg daily at bedtime.  Should there be a significant increase or change in symptoms, a journal is to be kept recording any foods eaten, beverages consumed, medications taken within a 6 hour period prior to the onset of symptoms, as well as record activities being performed, and environmental conditions. For any symptoms concerning for anaphylaxis, 911 is to be called immediately. 

## 2018-08-04 NOTE — Assessment & Plan Note (Signed)
   A prescription has been provided for Dermotic 0.01% oil 5 drops per ear canal twice daily for 14 days.

## 2018-09-13 ENCOUNTER — Telehealth: Payer: Self-pay | Admitting: Allergy and Immunology

## 2018-09-13 NOTE — Telephone Encounter (Signed)
PT was ordered labs by Dr Nunzio Cobbs for after endoscopy. Self pay pt, called to get prices for lab work & wants to know which specific labs to get, because it is too expensive. some tests are $400+ dollars. also noted, hpylori was negative on endoscopy.

## 2018-09-13 NOTE — Telephone Encounter (Signed)
Would probably start out with the following:  tryptase, and galactose-alpha-1,3-galactose IgE level. Next, in order of importance would be the thyroid antibodies and CU index.

## 2018-09-13 NOTE — Telephone Encounter (Signed)
Which labs would you prefer her to get first as price is too expensive for them all.

## 2018-09-14 NOTE — Telephone Encounter (Signed)
Lm for pt to call you back

## 2018-09-14 NOTE — Telephone Encounter (Signed)
Why don't we start with this: the patient will carefully avoid all mammalian meat. If hives persist despite this, then we will check some labs. Have patient continue to keep a symptom/exposure journal. Thanks.

## 2018-09-14 NOTE — Telephone Encounter (Signed)
Spoke with pt and she said the tryptase is $176, alpha is $127, thyroid peroxidase $40, thyroglobulin $52, chronic urticaria is $223, cbc $19, cmp $27, ana $27 with reflex $498, sed $16. Pt stated instead of paying for the alpha gal she just wants to avoid red meat at this time, and she also stated she just wanted to do what you think are the most  important ones first. Please advise

## 2018-09-15 NOTE — Telephone Encounter (Signed)
Lm for pt to call us back  

## 2019-01-11 NOTE — Telephone Encounter (Signed)
Pt called back and wanted to let us know she has not been avoiding mammal meat and is still having reactions. I asked pt to avoid mammal meat for a few weeks and then call us back with an update at that time. Informed her dr Verlin Fester is on vaca this week.

## 2019-02-02 ENCOUNTER — Telehealth: Payer: Self-pay

## 2019-02-02 NOTE — Telephone Encounter (Signed)
Spoke with pt she is going to call me back after looking at her husbands schedule (to watch the kids) and let me  know if she can come in tomorrow.

## 2019-02-02 NOTE — Telephone Encounter (Signed)
Appoint scheduled for 4 pm July 9th per pt

## 2019-02-02 NOTE — Telephone Encounter (Signed)
Hello,  I developed this rash about 3 or 4 days ago and it burns and itches. It is kind of hard to see in pictures but I attached them. I have had the burning and itching there for a while but have never noticed a rash there. I'm still having extreme itching inside of my ears and random itching/rashes on my body. I am on day 10 of no mammal meat and do not really see a difference. I am currently taking 2 xzyal and 2 Pepcid. What would you recommend I do? Thank you.

## 2019-02-02 NOTE — Telephone Encounter (Signed)
She may need to come in for a visit so the rash can be examined to assist with recommendations.  Thanks.

## 2019-02-03 ENCOUNTER — Encounter: Payer: Self-pay | Admitting: Allergy and Immunology

## 2019-02-03 ENCOUNTER — Other Ambulatory Visit: Payer: Self-pay

## 2019-02-03 ENCOUNTER — Ambulatory Visit (INDEPENDENT_AMBULATORY_CARE_PROVIDER_SITE_OTHER): Payer: Self-pay | Admitting: Allergy and Immunology

## 2019-02-03 DIAGNOSIS — L500 Allergic urticaria: Secondary | ICD-10-CM

## 2019-02-03 DIAGNOSIS — H60549 Acute eczematoid otitis externa, unspecified ear: Secondary | ICD-10-CM

## 2019-02-03 DIAGNOSIS — J310 Chronic rhinitis: Secondary | ICD-10-CM

## 2019-02-03 MED ORDER — HYDROXYZINE HCL 25 MG PO TABS: ORAL_TABLET | ORAL | 5 refills | Status: DC

## 2019-02-03 NOTE — Patient Instructions (Addendum)
Recurrent urticaria Currently with suboptimal control.  Prednisone has been provided: 10 mg daily x7 days.  Increase levocetirizine (Xyzal) as needed, may go as high as 5 mg 4 times daily.  To avoid diminishing benefit with daily use (tachyphylaxis) of second generation antihistamine, consider alternating every few months between fexofenadine (Allegra) and levocetirizine (Xyzal).  Continue famotidine (Pepcid) 20 mg twice daily  A prescription has been provided for hydroxyzine 25 mg to 50 mg at bedtime as needed for itching.  Labs have been ordered: Serum tryptase, ANA, thyroid antibodies, CU index.  Chronic rhinitis  Nasacort AQ, 2 sprays per nostril daily as needed.  Nasal saline lavage (NeilMed) has been recommended as needed and prior to medicated nasal sprays along with instructions for proper administration.  For thick post nasal drainage, add guaifenesin 1200 mg (Mucinex Maximum Strength)  twice daily as needed with adequate hydration as discussed.  Dermatitis of ear canal  Dermotic 0.01% oil 5 drops per ear canal twice daily for 14 days.   Return in about 6 months (around 08/06/2019), or if symptoms worsen or fail to improve.

## 2019-02-03 NOTE — Assessment & Plan Note (Signed)
   Dermotic 0.01% oil 5 drops per ear canal twice daily for 14 days.

## 2019-02-03 NOTE — Assessment & Plan Note (Signed)
   Nasacort AQ, 2 sprays per nostril daily as needed.  Nasal saline lavage (NeilMed) has been recommended as needed and prior to medicated nasal sprays along with instructions for proper administration.  For thick post nasal drainage, add guaifenesin 1200 mg (Mucinex Maximum Strength)  twice daily as needed with adequate hydration as discussed.

## 2019-02-03 NOTE — Progress Notes (Signed)
Follow-up Note  RE: Susan Hines MRN: [id number] DOB: 04/15/1991 Date of Office Visit: 02/03/2019  Primary care provider: Bosie Closice, Kathleen M, MD Referring provider: Bosie Closice, Kathleen M, MD  History of present illness: Susan Hines is a 28 y.o. female with recurrent urticaria and dermatitis of the ear canal presenting today for follow-up.  She was previously seen in this office for her initial evaluation on August 04, 2018.  She reports that over the past several weeks she has been experiencing a recurrence of urticaria, particularly on her face and her neck.  However she notes that she has been pruritic "all over".  She continues to experience pruritus of the ear canals.  She did not have the labs drawn which were ordered on her initial visit because she was concerned that her insurance would not pay for them.  She called our office a few weeks ago to find out which labs were "most important".  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  She reports that she has meticulously avoided mammalian meat and her symptoms have persisted over the 2 weeks of avoidance.  She has not experienced concomitant angioedema, cardiopulmonary symptoms, or GI symptoms.  She reports that she had been taking loratadine and famotidine twice daily.  She attempted to go without the loratadine and her hands became intensely pruritic.  Over the past 2 weeks she has been taking levocetirizine twice daily and famotidine twice daily. She also complains today of thick postnasal drainage "constantly."    Assessment and plan: Recurrent urticaria Currently with suboptimal control.  Prednisone has been provided: 10 mg daily x7 days.  Increase levocetirizine (Xyzal) as needed, may go as high as 5 mg 4 times daily.  To avoid diminishing benefit with daily use (tachyphylaxis) of second generation antihistamine, consider  alternating every few months between fexofenadine (Allegra) and levocetirizine (Xyzal).  Continue famotidine (Pepcid) 20 mg twice daily  A prescription has been provided for hydroxyzine 25 mg to 50 mg at bedtime as needed for itching.  Labs have been ordered: Serum tryptase, ANA, thyroid antibodies, CU index.  Chronic rhinitis  Nasacort AQ, 2 sprays per nostril daily as needed.  Nasal saline lavage (NeilMed) has been recommended as needed and prior to medicated nasal sprays along with instructions for proper administration.  For thick post nasal drainage, add guaifenesin 1200 mg (Mucinex Maximum Strength)  twice daily as needed with adequate hydration as discussed.  Dermatitis of ear canal  Dermotic 0.01% oil 5 drops per ear canal twice daily for 14 days.   Meds ordered this encounter  Medications  . hydrOXYzine (ATARAX/VISTARIL) 25 MG tablet    Sig: 1-2 tablets (25-50 mg) at bedtime as needed for itching    Dispense:  60 tablet    Refill:  5    Physical examination: Blood pressure 96/80, pulse 94, temperature 99.2 F (37.3 C), temperature source Oral, resp. rate 16, SpO2 97 %, unknown if currently breastfeeding.  General: Alert, interactive, in no acute distress. HEENT: TMs pearly gray, turbinates moderately edematous without discharge, post-pharynx moderately erythematous. Neck: Supple without lymphadenopathy. Lungs: Clear to auscultation without wheezing, rhonchi or rales. CV: Normal S1, S2 without murmurs. Skin: Blotchy, erythematous patches on the face and anterior neck.  The following portions of the patient's history were reviewed and updated as appropriate: allergies, current medications, past family history, past medical history, past social history, past surgical history  and problem list.  Allergies as of 02/03/2019      Reactions   Augmentin [amoxicillin-pot Clavulanate] Diarrhea, Itching   Has patient had a PCN reaction causing immediate rash,  facial/tongue/throat swelling, SOB or lightheadedness with hypotension: no Has patient had a PCN reaction causing severe rash involving mucus membranes or skin necrosis: no Has patient had a PCN reaction that required hospitalization: no Has patient had a PCN reaction occurring within the last 10 years: yes If all of the above answers are "NO", then may proceed with Cephalosporin use.      Medication List       Accurate as of February 03, 2019  6:32 PM. If you have any questions, ask your nurse or doctor.        diphenhydrAMINE 12.5 MG/5ML elixir Commonly known as: BENADRYL Take by mouth 4 (four) times daily as needed.   famotidine 20 MG tablet Commonly known as: PEPCID Take 20 mg by mouth 2 (two) times daily.   FLUoxetine 20 MG capsule Commonly known as: PROZAC Take 20 mg by mouth daily.   fluticasone 0.05 % cream Commonly known as: CUTIVATE Apply topically.   hydrOXYzine 25 MG tablet Commonly known as: ATARAX/VISTARIL 1-2 tablets (25-50 mg) at bedtime as needed for itching Started by: Edmonia Lynch, MD   ibuprofen 600 MG tablet Commonly known as: ADVIL Take 1 tablet (600 mg total) by mouth every 6 (six) hours.   levocetirizine 5 MG tablet Commonly known as: XYZAL Take 1 tablet (5 mg total) by mouth daily as needed.   loratadine 10 MG tablet Commonly known as: CLARITIN Take 10 mg by mouth daily.   montelukast 10 MG tablet Commonly known as: Singulair Take 1 tablet (10 mg total) by mouth at bedtime.       Allergies  Allergen Reactions  . Augmentin [Amoxicillin-Pot Clavulanate] Diarrhea and Itching    Has patient had a PCN reaction causing immediate rash, facial/tongue/throat swelling, SOB or lightheadedness with hypotension: no Has patient had a PCN reaction causing severe rash involving mucus membranes or skin necrosis: no Has patient had a PCN reaction that required hospitalization: no Has patient had a PCN reaction occurring within the last 10 years: yes  If all of the above answers are "NO", then may proceed with Cephalosporin use.    Review of systems: Review of systems negative except as noted in HPI / PMHx or noted below: Constitutional: Negative.  HENT: Negative.   Eyes: Negative.  Respiratory: Negative.   Cardiovascular: Negative.  Gastrointestinal: Negative.  Genitourinary: Negative.  Musculoskeletal: Negative.  Neurological: Negative.  Endo/Heme/Allergies: Negative.  Cutaneous: Negative.  Past Medical History:  Diagnosis Date  . Anemia   . Anxiety   . Bacterial vaginosis   . H. pylori infection 06/2016  . Headache(784.0)   . Hx of varicella   . Hyperemesis   . Infection   . IUGR (intrauterine growth restriction) 06/16/2013  . Migraines   . Paroxysmal tachycardia ([place])   . PONV (postoperative nausea and vomiting)   . SVD (spontaneous vaginal delivery) 06/18/2013  . Yeast cystitis     Family History  Problem Relation Age of Onset  . Heart disease Maternal Grandmother   . Heart disease Maternal Grandfather   . Cancer Paternal Grandmother   . Allergic rhinitis Sister   . Bronchitis Sister   . Eczema Son   . Eczema Daughter   . Asthma Daughter   . Urticaria Neg Hx   . Immunodeficiency Neg Hx   .  Atopy Neg Hx   . Angioedema Neg Hx     Social History   Socioeconomic History  . Marital status: Married    Spouse name: Not on file  . Number of children: Not on file  . Years of education: Not on file  . Highest education level: Not on file  Occupational History  . Not on file  Social Needs  . Financial resource strain: Not on file  . Food insecurity    Worry: Not on file    Inability: Not on file  . Transportation needs    Medical: Not on file    Non-medical: Not on file  Tobacco Use  . Smoking status: Never Smoker  . Smokeless tobacco: Never Used  Substance and Sexual Activity  . Alcohol use: Yes    Comment: occasional  . Drug use: No  . Sexual activity: Yes    Birth control/protection: None   Lifestyle  . Physical activity    Days per week: Not on file    Minutes per session: Not on file  . Stress: Not on file  Relationships  . Social Musicianconnections    Talks on phone: Not on file    Gets together: Not on file    Attends religious service: Not on file    Active member of club or organization: Not on file    Attends meetings of clubs or organizations: Not on file    Relationship status: Not on file  . Intimate partner violence    Fear of current or ex partner: Not on file    Emotionally abused: Not on file    Physically abused: Not on file    Forced sexual activity: Not on file  Other Topics Concern  . Not on file  Social History Narrative  . Not on file    I appreciate the opportunity to take part in Susan Hines's care. Please do not hesitate to contact me with questions.  Sincerely,   R. Jorene Guestarter Lindwood Mogel, MD

## 2019-02-03 NOTE — Assessment & Plan Note (Addendum)
Currently with suboptimal control.  Prednisone has been provided: 10 mg daily x7 days.  Increase levocetirizine (Xyzal) as needed, may go as high as 5 mg 4 times daily.  To avoid diminishing benefit with daily use (tachyphylaxis) of second generation antihistamine, consider alternating every few months between fexofenadine (Allegra) and levocetirizine (Xyzal).  Continue famotidine (Pepcid) 20 mg twice daily  A prescription has been provided for hydroxyzine 25 mg to 50 mg at bedtime as needed for itching.  Labs have been ordered: Serum tryptase, ANA, thyroid antibodies, CU index.

## 2019-06-20 ENCOUNTER — Telehealth: Payer: Self-pay | Admitting: *Deleted

## 2019-06-20 NOTE — Telephone Encounter (Signed)
Pr called inquiring about lab results.

## 2019-06-20 NOTE — Telephone Encounter (Signed)
There is still some lab results pending, however the ones of come back so far have all been negative/within normal limits. Thanks.

## 2019-06-21 LAB — ANA W/REFLEX IF POSITIVE: Anti Nuclear Antibody (ANA): NEGATIVE

## 2019-06-21 LAB — THYROID PEROXIDASE ANTIBODY: Thyroperoxidase Ab SerPl-aCnc: 10 IU/mL (ref 0–34)

## 2019-06-21 LAB — TRYPTASE: Tryptase: 8 ug/L (ref 2.2–13.2)

## 2019-06-21 LAB — THYROGLOBULIN ANTIBODY: Thyroglobulin Antibody: <1 IU/mL (ref 0.0–0.9)

## 2019-06-21 LAB — SEDIMENTATION RATE: Sed Rate: 19 mm/hr (ref 0–32)

## 2019-06-21 LAB — CHRONIC URTICARIA: cu index: 3.5 (ref <10)

## 2020-01-31 ENCOUNTER — Other Ambulatory Visit: Payer: Self-pay | Admitting: Obstetrics & Gynecology

## 2020-01-31 DIAGNOSIS — N643 Galactorrhea not associated with childbirth: Secondary | ICD-10-CM

## 2020-02-22 ENCOUNTER — Ambulatory Visit
Admission: RE | Admit: 2020-02-22 | Discharge: 2020-02-22 | Disposition: A | Discharge status: Home / Self Care | Payer: Self-pay | Source: Ambulatory Visit | Attending: Obstetrics & Gynecology | Admitting: Obstetrics & Gynecology

## 2020-02-22 ENCOUNTER — Other Ambulatory Visit: Payer: Self-pay

## 2020-02-22 DIAGNOSIS — N643 Galactorrhea not associated with childbirth: Secondary | ICD-10-CM

## 2021-08-02 ENCOUNTER — Emergency Department (HOSPITAL_BASED_OUTPATIENT_CLINIC_OR_DEPARTMENT_OTHER)
Admission: EM | Admit: 2021-08-02 | Discharge: 2021-08-02 | Disposition: A | Discharge status: Home / Self Care | Payer: Self-pay | Attending: Emergency Medicine | Admitting: Emergency Medicine

## 2021-08-02 ENCOUNTER — Encounter (HOSPITAL_BASED_OUTPATIENT_CLINIC_OR_DEPARTMENT_OTHER): Payer: Self-pay

## 2021-08-02 ENCOUNTER — Emergency Department (HOSPITAL_BASED_OUTPATIENT_CLINIC_OR_DEPARTMENT_OTHER): Payer: Self-pay | Admitting: Radiology

## 2021-08-02 ENCOUNTER — Other Ambulatory Visit: Payer: Self-pay

## 2021-08-02 DIAGNOSIS — R0602 Shortness of breath: Secondary | ICD-10-CM | POA: Insufficient documentation

## 2021-08-02 DIAGNOSIS — R002 Palpitations: Secondary | ICD-10-CM | POA: Insufficient documentation

## 2021-08-02 DIAGNOSIS — R0789 Other chest pain: Secondary | ICD-10-CM | POA: Insufficient documentation

## 2021-08-02 LAB — PREGNANCY, URINE: Preg Test, Ur: NEGATIVE

## 2021-08-02 LAB — BASIC METABOLIC PANEL
Anion gap: 10 (ref 5–15)
BUN: 10 mg/dL (ref 6–20)
CO2: 27 mmol/L (ref 22–32)
Calcium: 9 mg/dL (ref 8.9–10.3)
Chloride: 101 mmol/L (ref 98–111)
Creatinine, Ser: 0.76 mg/dL (ref 0.44–1.00)
GFR, Estimated: >60 mL/min (ref >60)
Glucose, Bld: 95 mg/dL (ref 70–99)
Potassium: 3.3 mmol/L — ABNORMAL LOW (ref 3.5–5.1)
Sodium: 138 mmol/L (ref 135–145)

## 2021-08-02 LAB — D-DIMER, QUANTITATIVE: D-Dimer, Quant: <0.27 ug/mL-FEU (ref 0.00–0.50)

## 2021-08-02 LAB — CBC
HCT: 41.2 % (ref 36.0–46.0)
Hemoglobin: 13.5 g/dL (ref 12.0–15.0)
MCH: 30.7 pg (ref 26.0–34.0)
MCHC: 32.8 g/dL (ref 30.0–36.0)
MCV: 93.6 fL (ref 80.0–100.0)
Platelets: 357 10*3/uL (ref 150–400)
RBC: 4.4 MIL/uL (ref 3.87–5.11)
RDW: 13.9 % (ref 11.5–15.5)
WBC: 8.4 10*3/uL (ref 4.0–10.5)
nRBC: 0 % (ref 0.0–0.2)

## 2021-08-02 LAB — TROPONIN I (HIGH SENSITIVITY)
Troponin I (High Sensitivity): <2 ng/L (ref <18)
Troponin I (High Sensitivity): <2 ng/L (ref <18)

## 2021-08-02 MED ORDER — HYDROXYZINE HCL 25 MG PO TABS
25.0000 mg | ORAL_TABLET | Freq: Once | ORAL | Status: AC
  Administered 2021-08-02: 25 mg via ORAL
  Filled 2021-08-02: qty 1

## 2021-08-02 MED ORDER — HYDROXYZINE HCL 25 MG PO TABS
25.0000 mg | ORAL_TABLET | Freq: Four times a day (QID) | ORAL | 0 refills | Status: AC | PRN
Start: 2021-08-02 — End: ?

## 2021-08-02 NOTE — ED Triage Notes (Signed)
Pt presents with Chest pressure/tightness last night. Associated with SOB and "heart pounding"  BP at home 169/100

## 2021-08-02 NOTE — ED Provider Notes (Signed)
[facility] EMERGENCY DEPT Provider Note   CSN: [id number] Arrival date & time: 08/02/21  0930     History Chief Complaint  Patient presents with   Chest Pain    Susan Hines is a 31 y.o. female presents the emergency department for evaluation of chest pain/pressure along with shortness of breath since last night.  The patient reports she was sitting on her couch scrolling on her phone watching television when she had gradually worsening chest pressure/heaviness/tightness and palpitations along with some shortness of breath.  She decided to take her blood pressure and it read 169/100, which she reports normally not that high for her.  She reports she took it multiple times manually and came out with the same result.  She denies any lightheadedness, nausea, vomiting, diaphoresis.  She denies any abdominal pain or headache.  Patient reports she recently had the flu in early November.  Additionally, she mentions that she was having bouts of tachycardia while she was pregnant and followed up with a cardiologist and postpartum and was discharged.  Medical history includes chronic urticaria, anxiety, and GERD.  Denies any surgical history.  Daily medication includes Prozac, Pepcid, and Claritin.  No known drug allergies.  Denies any tobacco use or illicit drug use.  Reports 3-4 beers weekly.   Chest Pain Associated symptoms: palpitations and shortness of breath   Associated symptoms: no abdominal pain, no back pain, no cough, no diaphoresis, no fever and no vomiting       Home Medications Prior to Admission medications   Medication Sig Start Date End Date Taking? Authorizing Provider  hydrOXYzine (ATARAX) 25 MG tablet Take 1 tablet (25 mg total) by mouth every 6 (six) hours as needed for anxiety. 08/02/21  Yes Sherrell Puller, PA-C  diphenhydrAMINE (BENADRYL) 12.5 MG/5ML elixir Take by mouth 4 (four) times daily as needed.    [provider]  famotidine (PEPCID) 20 MG tablet  Take 20 mg by mouth 2 (two) times daily.    [provider]  FLUoxetine (PROZAC) 20 MG capsule Take 20 mg by mouth daily.    [provider]  fluticasone (CUTIVATE) 0.05 % cream Apply topically. 06/03/17   [provider]  ibuprofen (ADVIL,MOTRIN) 600 MG tablet Take 1 tablet (600 mg total) by mouth every 6 (six) hours. 11/22/16   Juliene Pina, CNM  levocetirizine (XYZAL) 5 MG tablet Take 1 tablet (5 mg total) by mouth daily as needed. 08/04/18   Bobbitt, [place] Muta, MD  loratadine (CLARITIN) 10 MG tablet Take 10 mg by mouth daily.    [provider]  montelukast (SINGULAIR) 10 MG tablet Take 1 tablet (10 mg total) by mouth at bedtime. Patient not taking: Reported on 02/03/2019 08/04/18   Bobbitt, Sed[place] Muta, MD      Allergies    Augmentin [amoxicillin-pot clavulanate]    Review of Systems   Review of Systems  Constitutional:  Negative for chills, diaphoresis and fever.  HENT:  Negative for ear pain and sore throat.   Eyes:  Negative for pain and visual disturbance.  Respiratory:  Positive for shortness of breath. Negative for cough.   Cardiovascular:  Positive for chest pain and palpitations. Negative for leg swelling.  Gastrointestinal:  Negative for abdominal pain and vomiting.  Genitourinary:  Negative for dysuria and hematuria.  Musculoskeletal:  Negative for arthralgias and back pain.  Skin:  Negative for color change and rash.  Neurological:  Negative for seizures and syncope.  All other systems reviewed and are  negative.  Physical Exam Updated Vital Signs BP 115/80    Pulse 83    Temp 98.6 F (37 C)    Resp (!) 23    LMP 07/27/2021    SpO2 100%  Physical Exam Vitals and nursing note reviewed.  Constitutional:      General: She is not in acute distress.    Appearance: Normal appearance. She is not toxic-appearing.  HENT:     Head: Normocephalic and atraumatic.  Eyes:     General: No scleral icterus. Cardiovascular:     Rate and Rhythm:  Normal rate and regular rhythm.     Heart sounds: Normal heart sounds. No murmur heard.    Comments: Borderline tachycardia Pulmonary:     Effort: Pulmonary effort is normal. No respiratory distress.     Breath sounds: Normal breath sounds.     Comments: Clear to auscultation bilaterally.  No respiratory distress, accessory muscle use, tripoding, nasal flaring, or cyanosis present.  Patient speaking in full sentences with ease.  Satting 99 to 100% on room air. No increase work of breath. Chest:     Comments: No tenderness to palpation. No deformity or overlying skin changes.  Abdominal:     General: Abdomen is flat. Bowel sounds are normal.     Palpations: Abdomen is soft.     Tenderness: There is no abdominal tenderness. There is no guarding or rebound.  Musculoskeletal:        General: No deformity.     Cervical back: Normal range of motion.  Skin:    General: Skin is warm and dry.  Neurological:     General: No focal deficit present.     Mental Status: She is alert. Mental status is at baseline.    ED Results / Procedures / Treatments   Labs (all labs ordered are listed, but only abnormal results are displayed) Labs Reviewed  BASIC METABOLIC PANEL - Abnormal; Notable for the following components:      Result Value   Potassium 3.3 (*)    All other components within normal limits  CBC  PREGNANCY, URINE  D-DIMER, QUANTITATIVE  TROPONIN I (HIGH SENSITIVITY)  TROPONIN I (HIGH SENSITIVITY)    EKG EKG Interpretation  Date/Time:  Friday August 02 2021 09:38:18 EST Ventricular Rate:  106 PR Interval:  134 QRS Duration: 70 QT Interval:  326 QTC Calculation: 433 R Axis:   56 Text Interpretation: Sinus tachycardia Otherwise normal ECG When compared with ECG of 28-Oct-2012 22:04, QRS axis Shifted left Confirmed by Davonna Belling [phone]) on 08/02/2021 1:28:16 PM  Radiology DG Chest 2 View  Result Date: 08/02/2021 CLINICAL DATA:  Chest pain. EXAM: CHEST - 2 VIEW COMPARISON:   None. FINDINGS: Cardiomediastinal silhouette is within normal limits. Lungs are well inflated. No focal consolidation or mass. No pleural effusion or pneumothorax. No acute displaced fracture. IMPRESSION: Normal chest. Electronically Signed   By: Michaelle Birks M.D.   On: 08/02/2021 10:07    Procedures Procedures    Medications Ordered in ED Medications  hydrOXYzine (ATARAX) tablet 25 mg (has no administration in time range)    ED Course/ Medical Decision Making/ A&P                           Medical Decision Making 31 year old female presents the emergency department for evaluation of chest pain.  Differential diagnosis includes is not limited to PE, pneumonia, pneumothorax, ACS, anxiety, costochondritis.  For vital signs, the patient is  normotensive, afebrile, normal heart rate, satting 100% on room air.  Patient is slightly tachypneic, but is speaking in full sentences showing no signs of increased work of breathing.  On physical exam, patient is nontender to her chest wall.  No overlying skin changes or deformities noted.  Her lungs are clear to auscultation bilaterally.  Normal heart rate.  Labs and imaging ordered in triage. Hydroxyzine ordered for anxiety.   I independently interpreted the patient's labs and imaging.  She had less than 2 troponins initially and on repeat.  CBC shows no signs of leukocytosis or anemia.  Pregnancy test negative.  BMP shows slightly decreased potassium at 3.3, otherwise no electrolyte abnormalities.  D-dimer is less than 0.27.  CXR shows no acute cardiopulmonary process. No signs off pneumonia or pneumothorax. EKG shows sinus tachycardia, otherwise normal. HEART score of 2.   I thoroughly discussed the imaging and lab findings with the patient.  Gave reassurance that this chest pain is less likely coming from a heart or her lungs and is more than likely her anxiety.  I recommended that she can still follow-up with a cardiologist if she is concerned given her  cardiac history during pregnancy.  I recommended the patient follow-up with her PCP for her increased anxiety and for further follow-up on this chest pain.  Given prescription of hydroxyzine.  Strict return precautions discussed.  Patient agrees with plan.  Patient is stable being discharged home in good condition.  Final Clinical Impression(s) / ED Diagnoses Final diagnoses:  Atypical chest pain    Rx / DC Orders ED Discharge Orders          Ordered    hydrOXYzine (ATARAX) 25 MG tablet  Every 6 hours PRN        08/02/21 1519              Sherrell Puller, PA-C 08/04/21 [id number]    Davonna Belling, MD 08/05/21 [phone]

## 2021-08-02 NOTE — Discharge Instructions (Addendum)
You were seen here today for evaluation of your chest pain and shortness of breath. All of the lab results and imaging were unremarkable.  The symptoms may be likely due to your anxiety.  I prescribed you hydroxyzine which you can take as needed for symptoms. Please test this before driving as it can make you sleepy. Additionally, your blood pressure has remained normal while in the ER. Please follow up your PCP for BP re-evaluation given your higher readings at home. If you have any concern, new or worsening symptoms, please return to the nearest ER.

## 2021-12-19 IMAGING — MG DIGITAL DIAGNOSTIC BILAT W/ TOMO W/ CAD
6 of 10 series · 6 of 30 positions shown · non-contrast
Comparison: None.

CLINICAL DATA: 36-year-old female complaining of spontaneous clear
and milky right nipple discharge since she stopped breast feeding
over a year ago.

EXAM:
DIGITAL DIAGNOSTIC BILATERAL MAMMOGRAM WITH CAD AND TOMO
ULTRASOUND RIGHT BREAST

[L CC synth-2D]
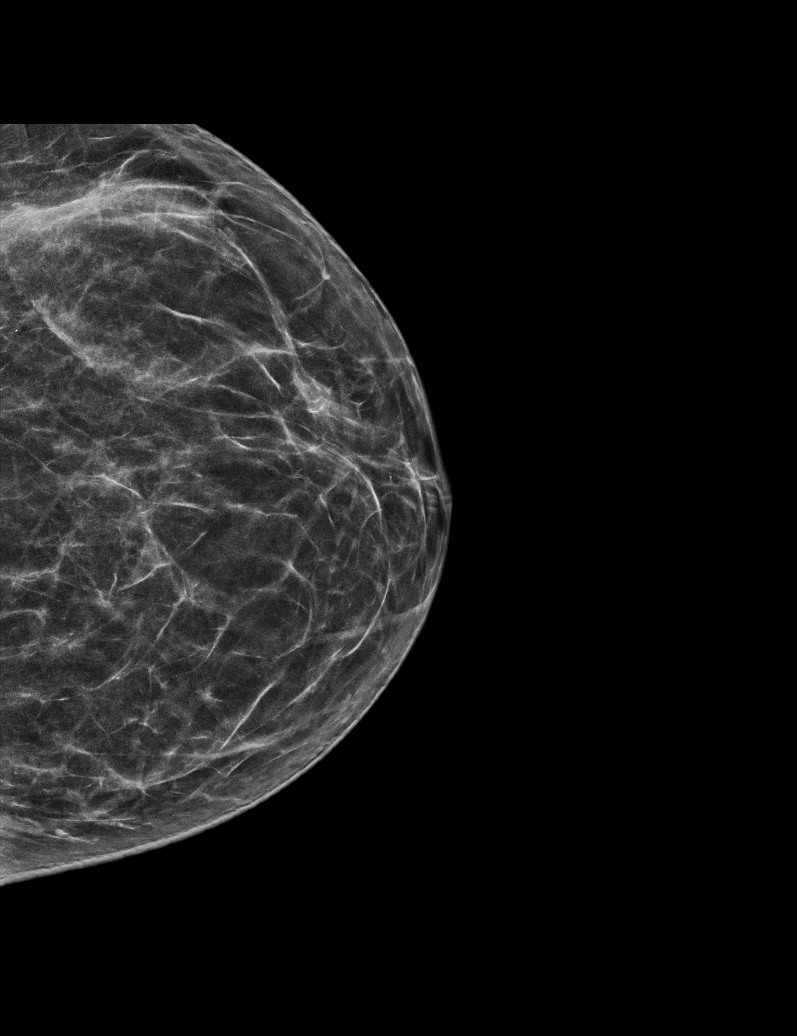

[R ML synth-2D]
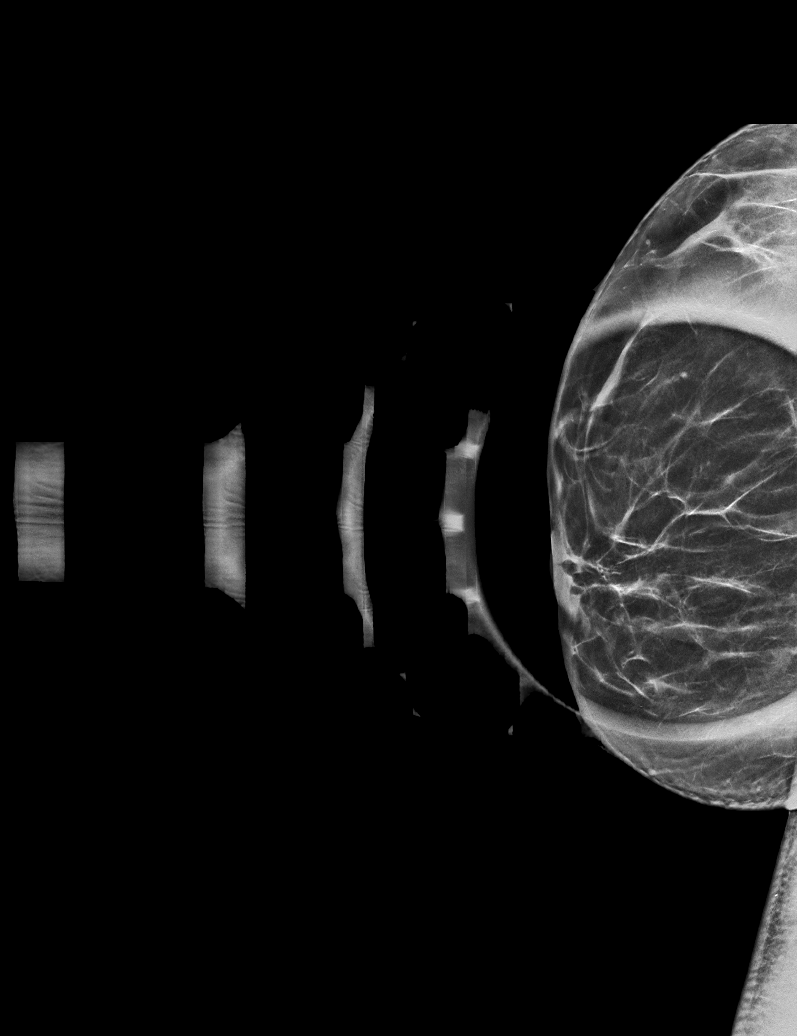

[L MLO synth-2D]
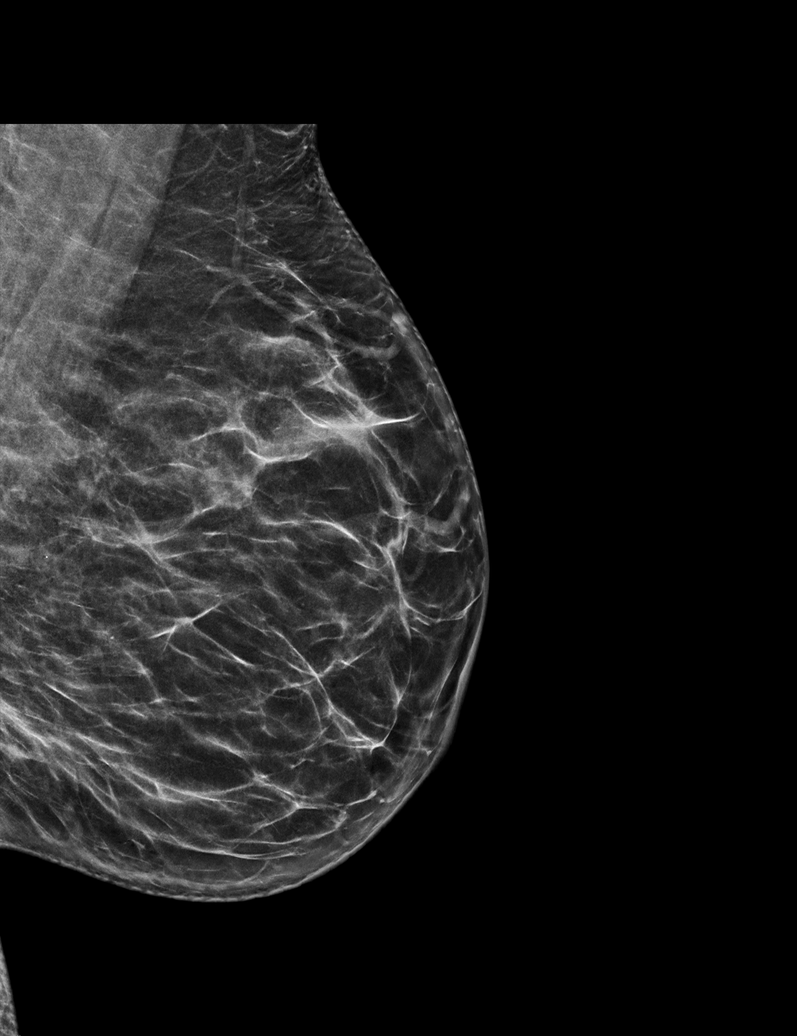

[R CC synth-2D]
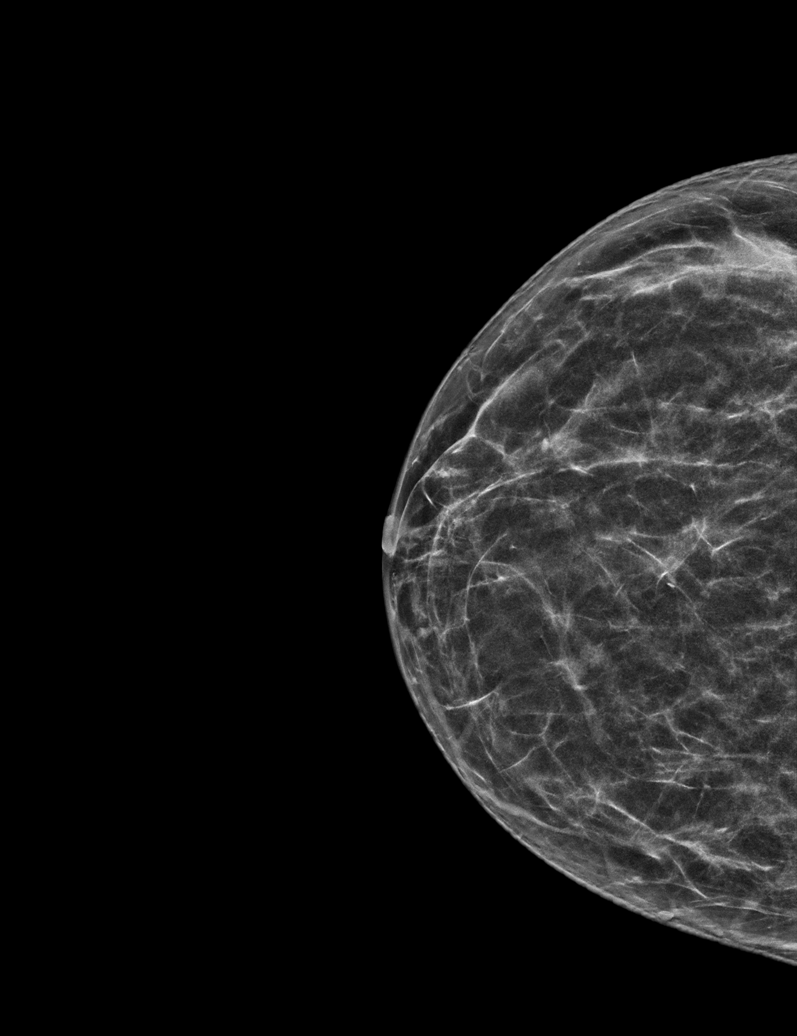

[R MLO synth-2D]
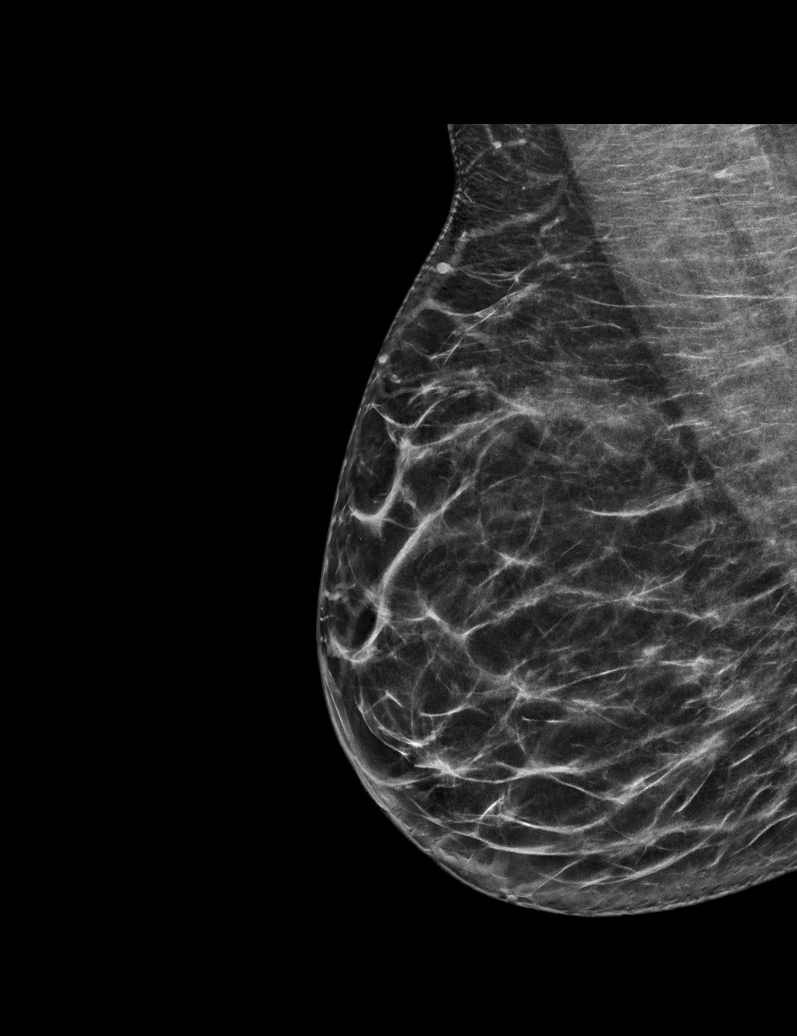

[L CC tomo · tomo slice 27/54.0]
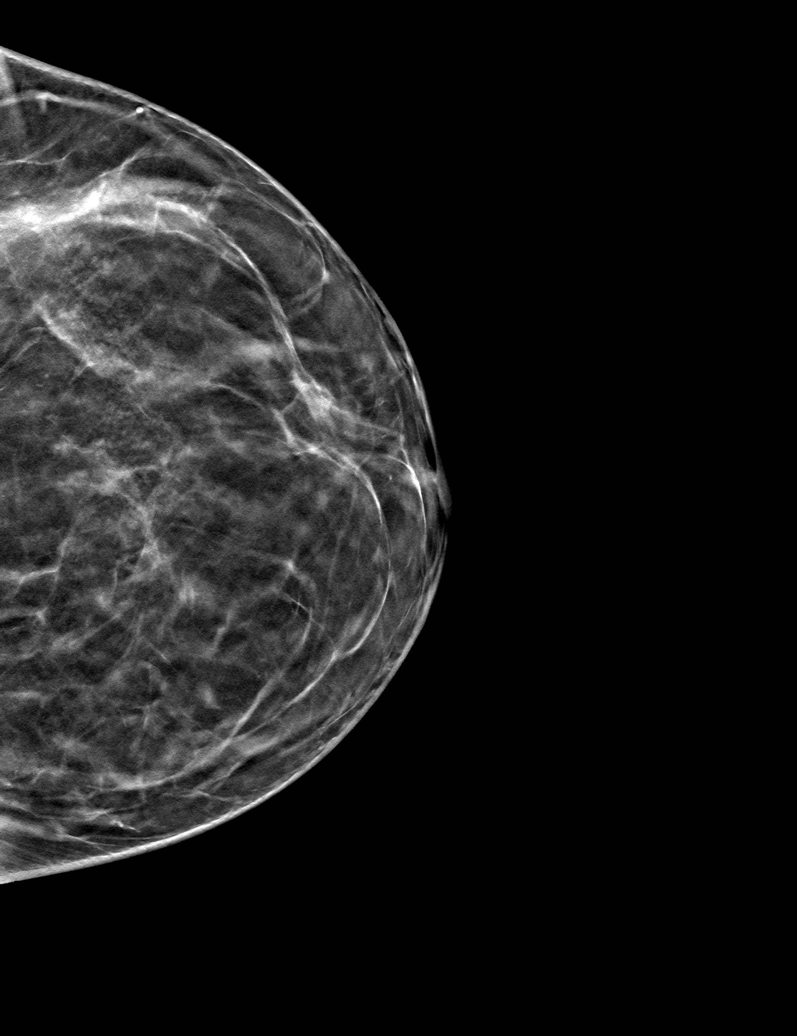

[6 of 30 positions shown; findings below may reference images not displayed]

ACR Breast Density Category b: There are scattered areas of
fibroglandular density.
FINDINGS: No suspicious mass, malignant type microcalcifications or distortion
detected in either breast. On the right MLO view there is a
partially imaged prominent lymph node in the right axilla.

Mammographic images were processed with CAD.

On physical exam, clear and milky discharge could be expressed from
several ducts on the right.

Targeted ultrasound is performed, showing normal tissue in the
subareolar region of the right breast. No ductal dilatation or
intraductal mass is identified. Sonographic evaluation of the right
axilla shows normal lymph nodes with no cortical thickening.
IMPRESSION: No evidence of malignancy in either breast.

RECOMMENDATION:
If the clinical exam remains benign/stable screening mammography can
be deferred until the age of 38.

Further evaluation of prolactin levels for the patient's nipple
discharge may be beneficial. If the prolactin levels are normal and
the nipple discharge is felt to be of clinical concern further
evaluation with MRI and surgical consult may be warranted.

I have discussed the findings and recommendations with the patient.
If applicable, a reminder letter will be sent to the patient
regarding the next appointment.

BI-RADS CATEGORY  1: Negative.

## 2022-07-31 ENCOUNTER — Emergency Department (HOSPITAL_BASED_OUTPATIENT_CLINIC_OR_DEPARTMENT_OTHER)
Admission: EM | Admit: 2022-07-31 | Discharge: 2022-07-31 | Discharge status: Against Medical Advice | Payer: Self-pay | Attending: Pediatrics | Admitting: Pediatrics

## 2022-07-31 ENCOUNTER — Encounter (HOSPITAL_BASED_OUTPATIENT_CLINIC_OR_DEPARTMENT_OTHER): Payer: Self-pay

## 2022-07-31 ENCOUNTER — Other Ambulatory Visit: Payer: Self-pay

## 2022-07-31 DIAGNOSIS — R1031 Right lower quadrant pain: Secondary | ICD-10-CM | POA: Insufficient documentation

## 2022-07-31 DIAGNOSIS — Z5321 Procedure and treatment not carried out due to patient leaving prior to being seen by health care provider: Secondary | ICD-10-CM | POA: Insufficient documentation

## 2022-07-31 LAB — URINALYSIS, ROUTINE W REFLEX MICROSCOPIC
Bilirubin Urine: NEGATIVE
Glucose, UA: NEGATIVE mg/dL
Ketones, ur: NEGATIVE mg/dL
Leukocytes,Ua: NEGATIVE
Nitrite: NEGATIVE
Protein, ur: NEGATIVE mg/dL
Specific Gravity, Urine: >=1.03 (ref 1.005–1.030)
pH: 6 (ref 5.0–8.0)

## 2022-07-31 LAB — URINALYSIS, MICROSCOPIC (REFLEX)

## 2022-07-31 LAB — PREGNANCY, URINE: Preg Test, Ur: NEGATIVE

## 2022-07-31 NOTE — ED Triage Notes (Signed)
Presents with complaints of right lower pain that radiates to back. 430 am this morning. Pt feels like unable to pass much urine. Says 6 days late with period and tests are negative

## 2023-04-29 ENCOUNTER — Ambulatory Visit (INDEPENDENT_AMBULATORY_CARE_PROVIDER_SITE_OTHER): Payer: Self-pay | Admitting: Neurology

## 2023-04-29 ENCOUNTER — Encounter: Payer: Self-pay | Admitting: Neurology

## 2023-04-29 ENCOUNTER — Ambulatory Visit
Admission: RE | Admit: 2023-04-29 | Discharge: 2023-04-29 | Disposition: A | Discharge status: Home / Self Care | Payer: Self-pay | Source: Ambulatory Visit | Attending: Neurology | Admitting: Neurology

## 2023-04-29 VITALS — BP 111/77 | HR 78 | Ht 60.0 in | Wt 168.6 lb

## 2023-04-29 DIAGNOSIS — R200 Anesthesia of skin: Secondary | ICD-10-CM

## 2023-04-29 DIAGNOSIS — M542 Cervicalgia: Secondary | ICD-10-CM

## 2023-04-29 DIAGNOSIS — G8929 Other chronic pain: Secondary | ICD-10-CM

## 2023-04-29 DIAGNOSIS — M533 Sacrococcygeal disorders, not elsewhere classified: Secondary | ICD-10-CM

## 2023-04-29 DIAGNOSIS — R202 Paresthesia of skin: Secondary | ICD-10-CM

## 2023-04-29 DIAGNOSIS — M545 Low back pain, unspecified: Secondary | ICD-10-CM

## 2023-04-29 DIAGNOSIS — M546 Pain in thoracic spine: Secondary | ICD-10-CM

## 2023-04-29 DIAGNOSIS — M419 Scoliosis, unspecified: Secondary | ICD-10-CM

## 2023-04-29 DIAGNOSIS — M5412 Radiculopathy, cervical region: Secondary | ICD-10-CM

## 2023-04-29 NOTE — Progress Notes (Signed)
[facility]    Provider:  Dr Lucia Gaskins Requesting Provider: Kathryne Eriksson* Primary Care Provider:  Kathryne Eriksson, FNP  CC: Numbness and tingling both hands R>L, sporadic, also neck pain on right side going on for about 4 months.   HPI:  Susan Hines is a 32 y.o. female referred [facility] [place] [facility] PCP Tonny Bollman, DO notes by Carney Bern, NP which I reviewed: Patient has a past medical history of esophageal reflux, anxiety disorder, migraine without aura.,  Class I obesity, allergic rhinitis, dermatitis of ear canal, mild episode of recurrent major depressive disorder, alcohol use disorder.  Patient referred for worsening numbness tingling pain in all extremities right hand more so for 2 weeks new onset, has history of alcohol abuse about 3 years ago now resolved, never had abnormal labs in the past per subjective of note, mom has fibromyalgia so concerned about that also neuropathy, wondering if the symptoms could be caused by scoliosis.  Physical exam was normal including neurologic no obvious deficits per note, B12, vitamin D, magnesium, BMP, CBC were ordered, January 16, 2023 labs reviewed showed B12 311, vitamin D 25-hydroxy 30.4, CMP normal with BUN 9 and creatinine 0.73, CBC normal. She has been supplementing with B12 and Vitamin D.   She is here alone and she has had years of occ numbness and tingling in the hands, worsening over 4 months, mainly the right side, and also has pain at the base of her skull on the right side and a shooting into the head(points to the right occipital area) and she, was told she has scoliosis from the chiropractor, no shooing pain into arm, no weakness. Sometimes her with arm gets achy below the elbow worse on the right. Tingling in all the fingers and palm and even righ tnow it feels half asleep just sitting. Used to be at night occ and now more frequent and no known inciting factor or event, shaking it out doesn;t  make it better, goes away on its own. No symptoms in the legs of consequence. Her mother and aunt have neuropathy and fibromyalgia and it is in their feet and pain. She has complete numbness while sleeping andmoves and can resolved with changing positions. No other focal neurologic deficits, associated symptoms, inciting events or modifiable factors.   Reviewed notes, labs and imaging from outside physicians, which showed:  Recent Results (from the past 2160 hour(s))  B12 and Folate Panel     Status: None   Collection Time: 04/29/23  8:44 AM  Result Value Ref Range   Vitamin B-12 416 232 - 1,245 pg/mL   Folate 5.5 >3.0 ng/mL    Comment: A serum folate concentration of less than 3.1 ng/mL is considered to represent clinical deficiency.   Vitamin B1     Status: None   Collection Time: 04/29/23  8:44 AM  Result Value Ref Range   Thiamine 108.7 66.5 - 200.0 nmol/L  Vitamin B6     Status: None (Preliminary result)   Collection Time: 04/29/23  8:44 AM  Result Value Ref Range   Vitamin B6 WILL FOLLOW   Hemoglobin A1c     Status: None   Collection Time: 04/29/23  8:44 AM  Result Value Ref Range   Hgb A1c MFr Bld 5.3 4.8 - 5.6 %    Comment:          Prediabetes: 5.7 - 6.4          Diabetes: >6.4  Glycemic control for adults with diabetes: <7.0    Est. average glucose Bld gHb Est-mCnc 105 mg/dL  Methylmalonic acid, serum     Status: None   Collection Time: 04/29/23  8:44 AM  Result Value Ref Range   Methylmalonic Acid 186 0 - 378 nmol/L     Review of Systems: Patient complains of symptoms per HPI as well as the following symptoms none. Pertinent negatives and positives per HPI. All others negative.   Social History   Socioeconomic History   Marital status: Married    Spouse name: Not on file   Number of children: Not on file   Years of education: Not on file   Highest education level: Not on file  Occupational History   Not on file  Tobacco Use   Smoking status:  Never   Smokeless tobacco: Never  Vaping Use   Vaping status: Never Used  Substance and Sexual Activity   Alcohol use: Yes    Comment: occasional   Drug use: No   Sexual activity: Yes    Birth control/protection: None  Other Topics Concern   Not on file  Social History Narrative   Caffiene 1 cup daily   Working Arts development officer   Social Determinants of Health   Financial Resource Strain: Low Risk  (11/28/2020)   Received from [facility] visits prior to 09/27/2022., [facility] visits prior to 09/27/2022.   Overall Financial Resource Strain (CARDIA)    Difficulty of Paying Living Expenses: Not hard at all  Food Insecurity: Low Risk  (01/13/2023)   [facility]   Hunger Vital Sign    Worried About Running Out of Food in the Last Year: Never true    Ran Out of Food in the Last Year: Never true  Transportation Needs: No Transportation Needs (01/13/2023)   [facility]   Transportation    In the past 12 months, has lack of reliable transportation kept you from medical appointments, meetings, work or from getting things needed for daily living? : No  Physical Activity: Insufficiently Active (11/28/2020)   Received from [facility] visits prior to 09/27/2022., [facility] visits prior to 09/27/2022.   Exercise Vital Sign    Days of Exercise per Week: 3 days    Minutes of Exercise per Session: 30 min  Stress: No Stress Concern Present (11/28/2020)   Received [facility] visits prior to 09/27/2022., [facility] visits prior to 09/27/2022.   [employer] of Occupational Health - Occupational Stress Questionnaire    Feeling of Stress : Only a little  Social Connections: Moderately Isolated (11/28/2020)   Received from [facility] visits prior to 09/27/2022., [facility]  visits prior to 09/27/2022.   Social Advertising account executive [NHANES]    Frequency of Communication with Friends and Family: More than three times a week    Frequency of Social Gatherings with Friends and Family: Twice a week    Attends Religious Services: Never    Database administrator or Organizations: No    Attends Banker Meetings: Never    Marital Status: Married  Catering manager Violence: Not At Risk (11/28/2020)   [facility] prior to 09/27/2022., [facility] visits prior to 09/27/2022.   Humiliation, Afraid, Rape, and  Kick questionnaire    Fear of Current or Ex-Partner: No    Emotionally Abused: No    Physically Abused: No    Sexually Abused: No    Family History  Problem Relation Age of Onset   Hyperlipidemia Mother    Hypertension Mother    Heart attack Father    Allergic rhinitis Sister    Bronchitis Sister    Heart disease Maternal Grandmother    Heart disease Maternal Grandfather    Cancer Paternal Grandmother    Breast cancer Paternal Grandmother    Eczema Daughter    Asthma Daughter    Eczema Son    Urticaria Neg Hx    Immunodeficiency Neg Hx    Atopy Neg Hx    Angioedema Neg Hx     Past Medical History:  Diagnosis Date   Anemia    Anxiety    Bacterial vaginosis    Chronic idiopathic urticaria    GERD (gastroesophageal reflux disease)    H. pylori infection 06/2016   Headache(784.0)    Hx of varicella    Hyperemesis    Infection    IUGR (intrauterine growth restriction) 06/16/2013   Kidney stone    Migraines    Paroxysmal tachycardia (HCC)    PONV (postoperative nausea and vomiting)    SVD (spontaneous vaginal delivery) 06/18/2013   Yeast cystitis     Patient Active Problem List   Diagnosis Date Noted   Chronic rhinitis 02/03/2019   Recurrent urticaria 08/04/2018   Dermatitis of ear canal 08/04/2018   Postpartum care following vaginal delivery 4/27 11/22/2016    Maternal anemia, with delivery 11/22/2016   Anxiety disorder 11/22/2016    Past Surgical History:  Procedure Laterality Date   CESAREAN SECTION  2014, 2018   it was vaginal   LAPAROSCOPIC ABDOMINAL EXPLORATION  2006   WISDOM TOOTH EXTRACTION  2006    Current Outpatient Medications  Medication Sig Dispense Refill   cetirizine (ZYRTEC) 10 MG tablet Take 10 mg by mouth daily.     escitalopram (LEXAPRO) 20 MG tablet Take 20 mg by mouth daily.     famotidine (PEPCID) 20 MG tablet Take 20 mg by mouth 2 (two) times daily.     ibuprofen (ADVIL,MOTRIN) 600 MG tablet Take 1 tablet (600 mg total) by mouth every 6 (six) hours. 30 tablet 0   No current facility-administered medications for this visit.    Allergies as of 04/29/2023 - Review Complete 04/29/2023  Allergen Reaction Noted   Augmentin [amoxicillin-pot clavulanate] Diarrhea and Itching 10/24/2012    Vitals: BP 111/77   Pulse 78   Ht 5' (1.524 m)   Wt 168 lb 9.6 oz (76.5 kg)   BMI 32.93 kg/m  Last Weight:  Wt Readings from Last 1 Encounters:  04/29/23 168 lb 9.6 oz (76.5 kg)   Last Height:   Ht Readings from Last 1 Encounters:  04/29/23 5' (1.524 m)     Physical exam: Exam: Gen: NAD, conversant, well nourised, obese, well groomed                     CV: RRR, no MRG. No Carotid Bruits. No peripheral edema, warm, nontender Eyes: Conjunctivae clear without exudates or hemorrhage  Neuro: Detailed Neurologic Exam  Speech:    Speech is normal; fluent and spontaneous with normal comprehension.  Cognition:    The patient is oriented to person, place, and time;     recent and remote memory intact;  language fluent;     normal attention, concentration,     fund of knowledge Cranial Nerves:    The pupils are equal, round, and reactive to light. The fundi are normal and spontaneous venous pulsations are present. Visual fields are full to finger confrontation. Extraocular movements are intact. Trigeminal sensation is  intact and the muscles of mastication are normal. The face is symmetric. The palate elevates in the midline. Hearing intact. Voice is normal. Shoulder shrug is normal. The tongue has normal motion without fasciculations.   Coordination: nml  Gait: nml  Motor Observation:    No asymmetry, no atrophy, and no involuntary movements noted. Tone:    Normal muscle tone.    Posture:    Posture is normal. normal erect    Strength:    Strength is V/V in the upper and lower limbs.      Sensation: intact to LT     Reflex Exam:  DTR's:    Deep tendon reflexes in the upper and lower extremities are brisk bilaterally with 2 beats clonus Ajs which could be norma for age Toes:    The toes are downgoing bilaterally.   Clonus: 2 beats clonus Ajs which could be norma for age   + mcpaheln's maneuver at wrists  Assessment/Plan:  32 year old with numbness and tingling in the hadns  Numbness in the feet positional when sitting too long likely compressive and unlikely neuropathy but will check labs and patient is very concerned over possible scoliosis Blood work  EMG/NCS - schedule at front desk mRi of the cervical spine - schedule, will call Xrays of the thoracic and lumbar spine and sacrum - walk in Then consider ortho for scoliosis based on results and PT Carpal Tunnel Syndrome likely in the upper exytremities: emg/ncs  Orders Placed This Encounter  Procedures   DG Thoracic Spine 2 View   DG Lumbar Spine 2-3 Views   DG Sacrum/Coccyx   MR CERVICAL SPINE WO CONTRAST   B12 and Folate Panel   Vitamin B1   Vitamin B6   Hemoglobin A1c   Methylmalonic acid, serum   NCV with EMG(electromyography)   No orders of the defined types were placed in this encounter.   Cc: Filbert Schilder, FNP  Naomie Dean, MD  [facility] [address] [place], [place] [address]  Phone [phone] Fax [phone]

## 2023-04-29 NOTE — Patient Instructions (Addendum)
Blood work  EMG/NCS - schedule at front desk mRi of the cervical spine - scheduled Xrays of the thoracic and lumbar spine and sacrum - walk in Then consider ortho for scoliosis based on results and PT  Carpal Tunnel Syndrome  Carpal tunnel syndrome is a condition that causes pain, numbness, and weakness in your hand and fingers. The carpal tunnel is a narrow area located on the palm side of your wrist. Repeated wrist motion or certain diseases may cause swelling within the tunnel. This swelling pinches the main nerve in the wrist. The main nerve in the wrist is called the median nerve. What are the causes? This condition may be caused by: Repeated and forceful wrist and hand motions. Wrist injuries. Arthritis. A cyst or tumor in the carpal tunnel. Fluid buildup during pregnancy. Use of tools that vibrate. Sometimes the cause of this condition is not known. What increases the risk? The following factors may make you more likely to develop this condition: Having a job that requires you to repeatedly or forcefully move your wrist or hand or requires you to use tools that vibrate. This may include jobs that involve using computers, working on an [employer], or working with power tools such as Radiographer, therapeutic. Being a woman. Having certain conditions, such as: Diabetes. Obesity. An underactive thyroid (hypothyroidism). Kidney failure. Rheumatoid arthritis. What are the signs or symptoms? Symptoms of this condition include: A tingling feeling in your fingers, especially in your thumb, index, and middle fingers. Tingling or numbness in your hand. An aching feeling in your entire arm, especially when your wrist and elbow are bent for a long time. Wrist pain that goes up your arm to your shoulder. Pain that goes down into your palm or fingers. A weak feeling in your hands. You may have trouble grabbing and holding items. Your symptoms may feel worse during the night. How is this  diagnosed? This condition is diagnosed with a medical history and physical exam. You may also have tests, including: Electromyogram (EMG). This test measures electrical signals sent by your nerves into the muscles. Nerve conduction study. This test measures how well electrical signals pass through your nerves. Imaging tests, such as X-rays, ultrasound, and MRI. These tests check for possible causes of your condition. How is this treated? This condition may be treated with: Lifestyle changes. It is important to stop or change the activity that caused your condition. Doing exercise and activities to strengthen and stretch your muscles and tendons (physical therapy). Making lifestyle changes to help with your condition and learning how to do your daily activities safely (occupational therapy). Medicines for pain and inflammation. This may include medicine that is injected into your wrist. A wrist splint or brace. Surgery. Follow these instructions at home: If you have a splint or brace: Wear the splint or brace as told by your health care provider. Remove it only as told by your health care provider. Loosen the splint or brace if your fingers tingle, become numb, or turn cold and blue. Keep the splint or brace clean. If the splint or brace is not waterproof: Do not let it get wet. Cover it with a watertight covering when you take a bath or shower. Managing pain, stiffness, and swelling If directed, put ice on the painful area. To do this: If you have a removeable splint or brace, remove it as told by your health care provider. Put ice in a plastic bag. Place a towel between your skin and the  bag or between the splint or brace and the bag. Leave the ice on for 20 minutes, 2-3 times a day. Do not fall asleep with the cold pack on your skin. Remove the ice if your skin turns bright red. This is very important. If you cannot feel pain, heat, or cold, you have a greater risk of damage to the  area. Move your fingers often to reduce stiffness and swelling. General instructions Take over-the-counter and prescription medicines only as told by your health care provider. Rest your wrist and hand from any activity that may be causing your pain. If your condition is work related, talk with your employer about changes that can be made, such as getting a wrist pad to use while typing. Do any exercises as told by your health care provider, physical therapist, or occupational therapist. Keep all follow-up visits. This is important. Contact a health care provider if: You have new symptoms. Your pain is not controlled with medicines. Your symptoms get worse. Get help right away if: You have severe numbness or tingling in your wrist or hand. Summary Carpal tunnel syndrome is a condition that causes pain, numbness, and weakness in your hand and fingers. It is usually caused by repeated wrist motions. Lifestyle changes and medicines are used to treat carpal tunnel syndrome. Surgery may be recommended. Follow your health care provider's instructions about wearing a splint, resting from activity, keeping follow-up visits, and calling for help. This information is not intended to replace advice given to you by your health care provider. Make sure you discuss any questions you have with your health care provider. Document Revised: 11/24/2019 Document Reviewed: 11/24/2019 Elsevier Patient Education  2024 Elsevier Inc.  Peripheral Neuropathy Peripheral neuropathy is a type of nerve damage. It affects nerves that carry signals between the spinal cord and the arms, legs, and the rest of the body (peripheral nerves). It does not affect nerves in the spinal cord or brain. In peripheral neuropathy, one nerve or a group of nerves may be damaged. Peripheral neuropathy is a broad category that includes many specific nerve disorders, like diabetic neuropathy, hereditary neuropathy, and carpal tunnel  syndrome. What are the causes? This condition may be caused by: Certain diseases, such as: Diabetes. This is the most common cause of peripheral neuropathy. Autoimmune diseases, such as rheumatoid arthritis and systemic lupus erythematosus. Nerve diseases that are passed from parent to child (inherited). Kidney disease. Thyroid disease. Other causes may include: Nerve injury. Pressure or stress on a nerve that lasts a long time. Lack (deficiency) of B vitamins. This can result from alcoholism, poor diet, or a restricted diet. Infections. Some medicines, such as cancer medicines (chemotherapy). Poisonous (toxic) substances, such as lead and mercury. Too little blood flowing to the legs. In some cases, the cause of this condition is not known. What are the signs or symptoms? Symptoms of this condition depend on which of your nerves is damaged. Symptoms in the legs, hands, and arms can include: Loss of feeling (numbness) in the feet, hands, or both. Tingling in the feet, hands, or both. Burning pain. Very sensitive skin. Weakness. Not being able to move a part of the body (paralysis). Clumsiness or poor coordination. Muscle twitching. Loss of balance. Symptoms in other parts of the body can include: Not being able to control your bladder. Feeling dizzy. Sexual problems. How is this diagnosed? Diagnosing and finding the cause of peripheral neuropathy can be difficult. Your health care provider will take your medical history and do  a physical exam. A neurological exam will also be done. This involves checking things that are affected by your brain, spinal cord, and nerves (nervous system). For example, your health care provider will check your reflexes, how you move, and what you can feel. You may have other tests, such as: Blood tests. Electromyogram (EMG) and nerve conduction tests. These tests check nerve function and how well the nerves are controlling the muscles. Imaging  tests, such as a CT scan or MRI, to rule out other causes of your symptoms. Removing a small piece of nerve to be examined in a lab (nerve biopsy). Removing and examining a small amount of the fluid that surrounds the brain and spinal cord (lumbar puncture). How is this treated? Treatment for this condition may involve: Treating the underlying cause of the neuropathy, such as diabetes, kidney disease, or vitamin deficiencies. Stopping medicines that can cause neuropathy, such as chemotherapy. Medicine to help relieve pain. Medicines may include: Prescription or over-the-counter pain medicine. Anti-seizure medicine. Antidepressants. Pain-relieving patches that are applied to painful areas of skin. Surgery to relieve pressure on a nerve or to destroy a nerve that is causing pain. Physical therapy to help improve movement and balance. Devices to help you move around (assistive devices). Follow these instructions at home: Medicines Take over-the-counter and prescription medicines only as told by your health care provider. Do not take any other medicines without first asking your health care provider. Ask your health care provider if the medicine prescribed to you requires you to avoid driving or using machinery. Lifestyle  Do not use any products that contain nicotine or tobacco. These products include cigarettes, chewing tobacco, and vaping devices, such as e-cigarettes. Smoking keeps blood from reaching damaged nerves. If you need help quitting, ask your health care provider. Avoid or limit alcohol. Too much alcohol can cause a vitamin B deficiency, and vitamin B is needed for healthy nerves. Eat a healthy diet. This includes: Eating foods that are high in fiber, such as beans, whole grains, and fresh fruits and vegetables. Limiting foods that are high in fat and processed sugars, such as fried or sweet foods. General instructions  If you have diabetes, work closely with your health care  provider to keep your blood sugar under control. If you have numbness in your feet: Check every day for signs of injury or infection. Watch for redness, warmth, and swelling. Wear padded socks and comfortable shoes. These help protect your feet. Develop a good support system. Living with peripheral neuropathy can be stressful. Consider talking with a mental health specialist or joining a support group. Use assistive devices and attend physical therapy as told by your health care provider. This may include using a walker or a cane. Keep all follow-up visits. This is important. Where to find more information [employer] of Neurological Disorders: [employer] Contact a health care provider if: You have new signs or symptoms of peripheral neuropathy. You are struggling emotionally from dealing with peripheral neuropathy. Your pain is not well controlled. Get help right away if: You have an injury or infection that is not healing normally. You develop new weakness in an arm or leg. You have fallen or do so frequently. Summary Peripheral neuropathy is when the nerves in the arms or legs are damaged, resulting in numbness, weakness, or pain. There are many causes of peripheral neuropathy, including diabetes, pinched nerves, vitamin deficiencies, autoimmune disease, and hereditary conditions. Diagnosing and finding the cause of peripheral neuropathy can be difficult. Your health  care provider will take your medical history, do a physical exam, and do tests, including blood tests and nerve function tests. Treatment involves treating the underlying cause of the neuropathy and taking medicines to help control pain. Physical therapy and assistive devices may also help. This information is not intended to replace advice given to you by your health care provider. Make sure you discuss any questions you have with your health care provider.  Electromyoneurogram Electromyoneurogram is a test to  check how well your muscles and nerves are working. This procedure includes the combined use of electromyogram (EMG) and nerve conduction study (NCS). EMG is used to evaluate muscles and the nerves that control those muscles. NCS, which is also called electroneurogram, measures how well your nerves conduct electricity. The procedures should be done together to check if your muscles and nerves are healthy. If the results of the tests are abnormal, this may indicate disease or injury, such as a neuromuscular disease or peripheral nerve damage. Tell a health care provider about: Any allergies you have. All medicines you are taking, including vitamins, herbs, eye drops, creams, and over-the-counter medicines. Any bleeding problems you have. Any surgeries you have had. Any medical conditions you have. What are the risks? Generally, this is a safe procedure. However, problems may occur, including: Bleeding or bruising. Infection where the electrodes were inserted. What happens before the test? Medicines Take all of your usually prescribed medications before this testing is performed. Do not stop your blood thinners unless advised by your prescribing physician. General instructions Your health care provider may ask you to warm the limb that will be checked with warm water, hot pack, or wrapping the limb in a blanket. Do not use lotions or creams on the same day that you will be having the procedure. What happens during the test? For EMG  Your health care provider will ask you to stay in a position so that the muscle being studied can be accessed. You will be sitting or lying down. You may be given a medicine to numb the area (local anesthetic) and the skin will be disinfected. A very thin needle that has an electrode will be inserted into your muscle, one muscle at a time. Typically, multiple muscles are evaluated during a single study. Another small electrode will be placed on your skin near the  muscle. Your health care provider will ask you to continue to remain still. The electrodes will record the electrical activity of your muscles. You may see this on a monitor or hear it in the room. After your muscles have been studied at rest, your health care provider will ask you to contract or flex your muscles. The electrodes will record the electrical activity of your muscles. Your health care provider will remove the electrodes and the electrode needle when the procedure is finished. The procedure may vary among health care providers and hospitals. For NCS  An electrode that records your nerve activity (recording electrode) will be placed on your skin by the muscle that is being studied. An electrode that is used as a reference (reference electrode) will be placed near the recording electrode. A paste or gel will be applied to your skin between the recording electrode and the reference electrode. Your nerve will be stimulated with a mild shock. The speed of the nerves and strength of response is recorded by the electrodes. Your health care provider will remove the electrodes and the gel when the procedure is finished. The procedure may vary among  health care providers and hospitals. What can I expect after the test? It is up to you to get your test results. Ask your health care provider, or the department that is doing the test, when your results will be ready. Your health care provider may: Give you medicines for any pain. Monitor the insertion sites to make sure that bleeding stops. You should be able to drive yourself to and from the test. Discomfort can persist for a few hours after the test, but should be better the next day. Contact a health care provider if: You have swelling, redness, or drainage at any of the insertion sites. Summary Electromyoneurogram is a test to check how well your muscles and nerves are working. If the results of the tests are abnormal, this may indicate  disease or injury. This is a safe procedure. However, problems may occur, such as bleeding and infection. Your health care provider will do two tests to complete this procedure. One checks your muscles (EMG) and another checks your nerves (NCS). It is up to you to get your test results. Ask your health care provider, or the department that is doing the test, when your results will be ready. This information is not intended to replace advice given to you by your health care provider. Make sure you discuss any questions you have with your health care provider. Document Revised: 03/27/2021 Document Reviewed: 02/24/2021 Elsevier Patient Education  2024 Elsevier Inc.  Document Revised: 03/19/2021 Document Reviewed: 03/19/2021 Elsevier Patient Education  2024 [employer].

## 2023-05-03 ENCOUNTER — Encounter: Payer: Self-pay | Admitting: Neurology

## 2023-05-05 ENCOUNTER — Encounter: Payer: Self-pay | Admitting: Neurology

## 2023-05-05 ENCOUNTER — Telehealth: Payer: Self-pay | Admitting: Neurology

## 2023-05-05 NOTE — Telephone Encounter (Signed)
Medicaid NPR sent to GI [phone]

## 2023-05-08 LAB — VITAMIN B1: Thiamine: 108.7 nmol/L (ref 66.5–200.0)

## 2023-05-08 LAB — VITAMIN B6: Vitamin B6: 6.2 ug/L (ref 3.4–65.2)

## 2023-05-08 LAB — B12 AND FOLATE PANEL
Folate: 5.5 ng/mL (ref >3.0)
Vitamin B-12: 416 pg/mL (ref 232–1245)

## 2023-05-08 LAB — HEMOGLOBIN A1C
Est. average glucose Bld gHb Est-mCnc: 105 mg/dL
Hgb A1c MFr Bld: 5.3 % (ref 4.8–5.6)

## 2023-05-08 LAB — METHYLMALONIC ACID, SERUM: Methylmalonic Acid: 186 nmol/L (ref 0–378)

## 2023-05-19 ENCOUNTER — Telehealth: Payer: Self-pay | Admitting: *Deleted

## 2023-05-19 NOTE — Telephone Encounter (Signed)
I spoke with the patient and discussed her xray results. Patient verbalized understanding and stated she wants to hold off for now on referral to ortho. She hasn't really been having much pain. Her questions were answered. She was advised if she would like a copy of the printed results and the CD she can get this from GI. She verbalized appreciation.

## 2023-05-19 NOTE — Telephone Encounter (Signed)
-----   Message from Anson Fret sent at 05/18/2023  7:45 PM EDT ----- No fractures or problems in the sacrum. You do have mild scoliosis of the lower lumbar spine if you would like a refer to orthopaedics to see if they can help with the pain, I will ask my team to call you thanks

## 2023-05-29 ENCOUNTER — Ambulatory Visit
Admission: RE | Admit: 2023-05-29 | Discharge: 2023-05-29 | Disposition: A | Discharge status: Home / Self Care | Payer: No Typology Code available for payment source | Source: Ambulatory Visit | Attending: Neurology

## 2023-05-29 DIAGNOSIS — M5412 Radiculopathy, cervical region: Secondary | ICD-10-CM

## 2023-05-29 DIAGNOSIS — R200 Anesthesia of skin: Secondary | ICD-10-CM

## 2023-05-29 DIAGNOSIS — M419 Scoliosis, unspecified: Secondary | ICD-10-CM

## 2023-05-29 DIAGNOSIS — M542 Cervicalgia: Secondary | ICD-10-CM

## 2023-05-30 IMAGING — DX DG CHEST 2V
2 series · 2 of 2 positions shown · non-contrast
Comparison: None.

CLINICAL DATA: Chest pain.

EXAM:
CHEST - 2 VIEW

[chest pa]
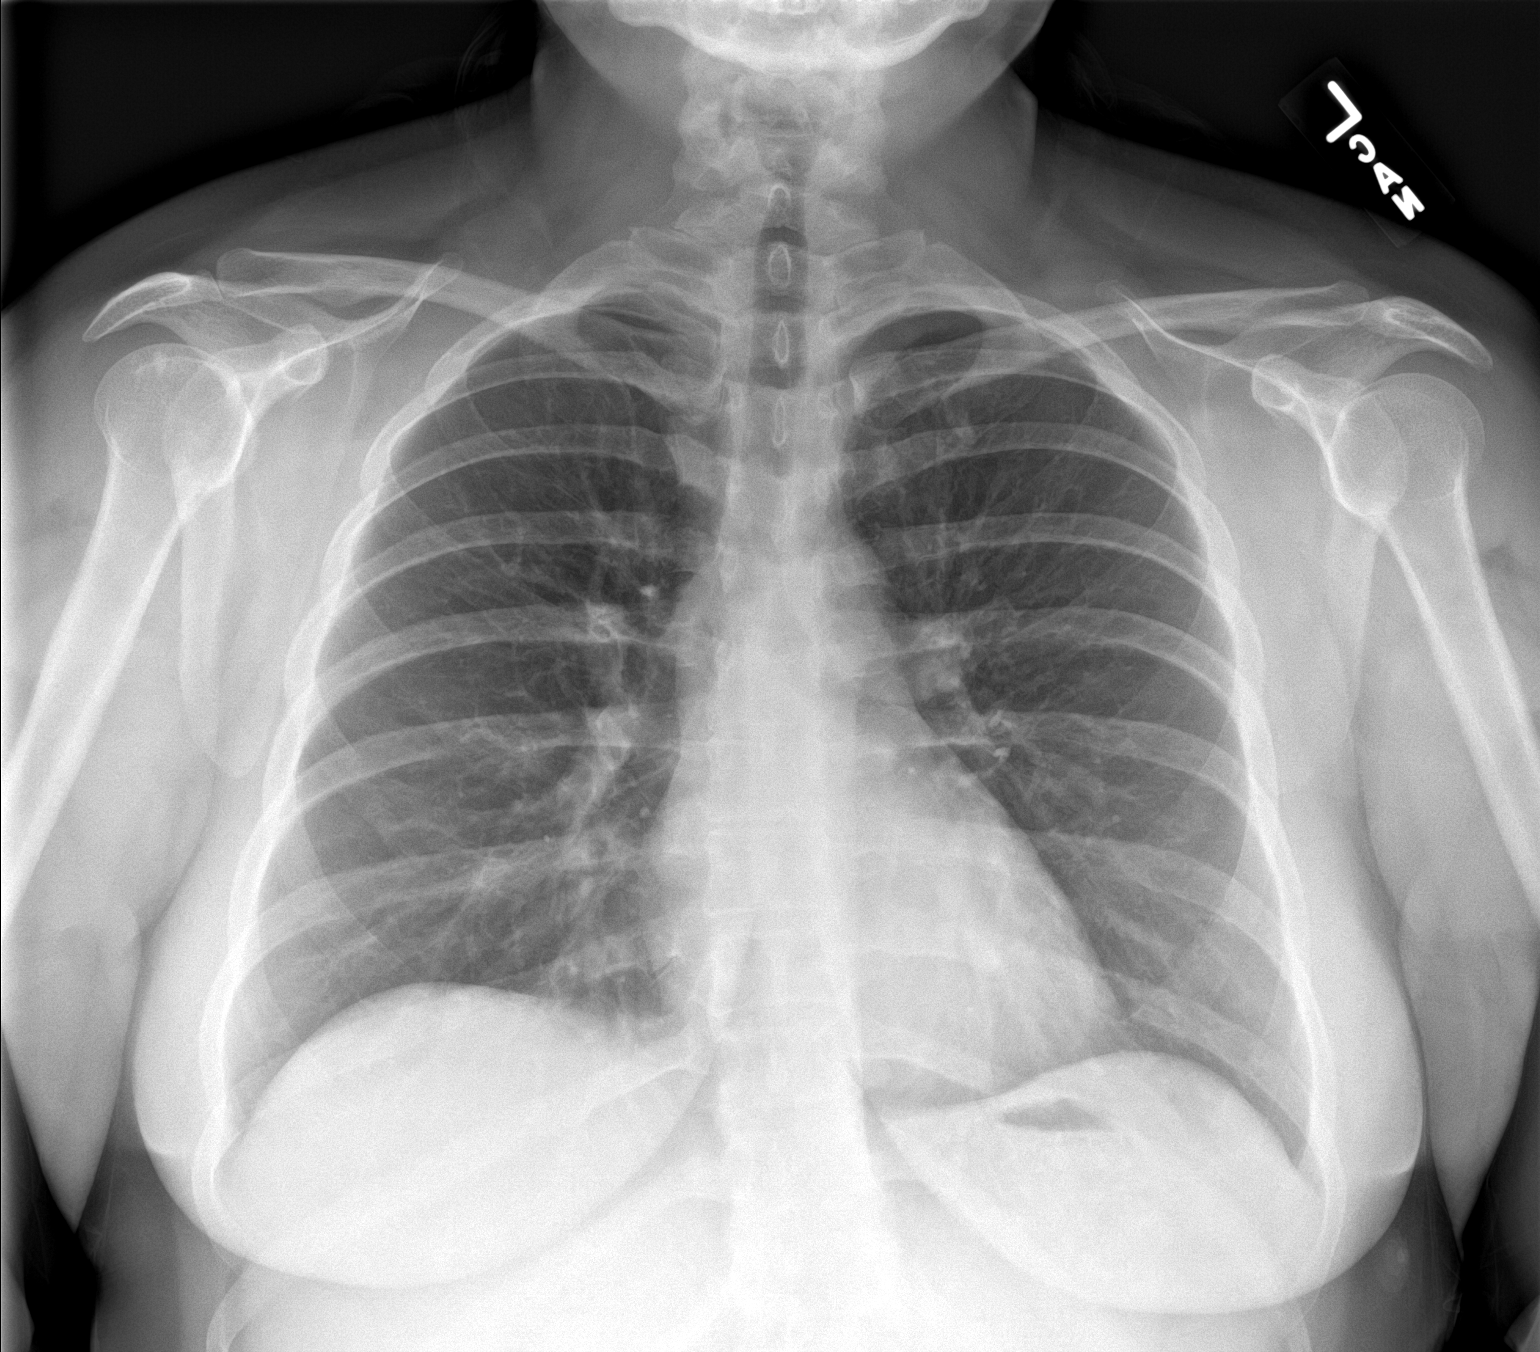

[chest lat]
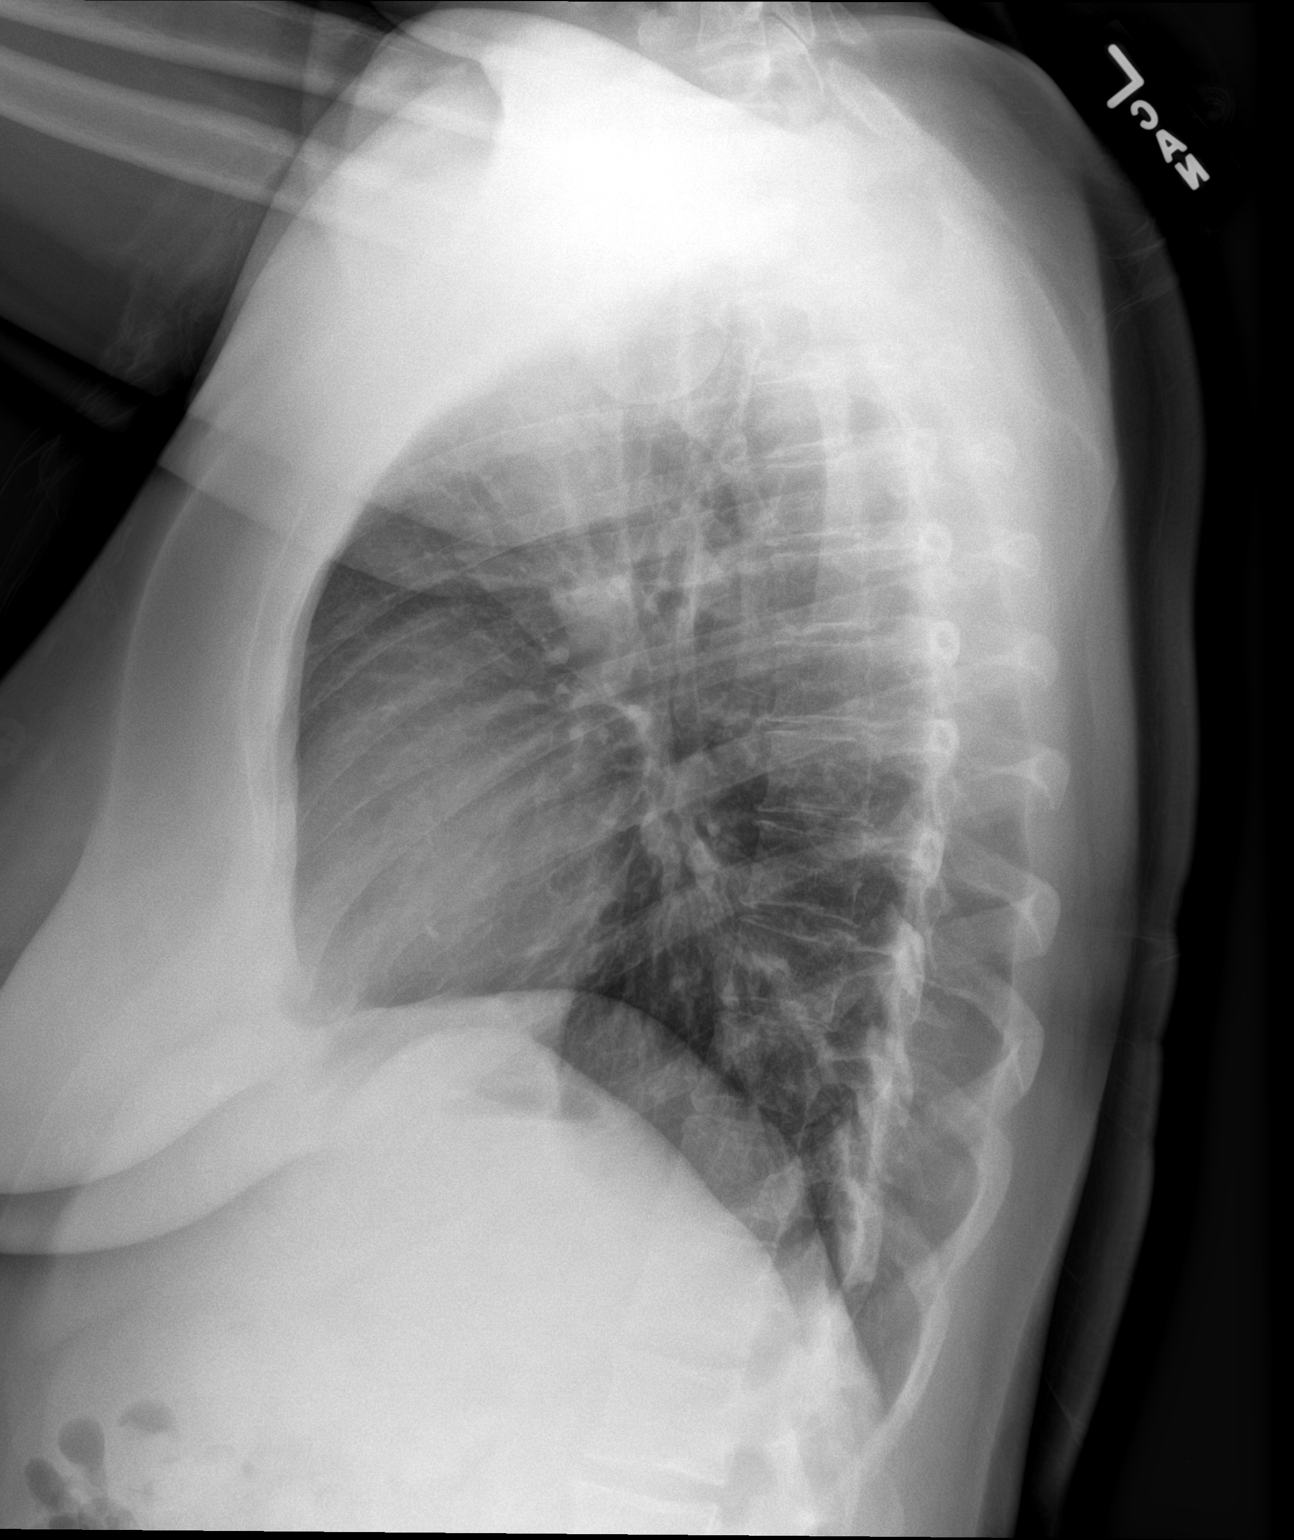

[2 of 2 positions shown; findings below may reference images not displayed]

FINDINGS: Cardiomediastinal silhouette is within normal limits. Lungs are well
inflated. No focal consolidation or mass. No pleural effusion or
pneumothorax. No acute displaced fracture.
IMPRESSION: Normal chest.

## 2023-06-01 ENCOUNTER — Telehealth: Payer: Self-pay | Admitting: *Deleted

## 2023-06-01 NOTE — Telephone Encounter (Signed)
-----   Message from Anson Fret sent at 06/01/2023  1:28 PM EST ----- Do not send to work in thanks

## 2023-06-01 NOTE — Telephone Encounter (Signed)
Spoke to patient gave MRI spine results Pt states saw results on mychart  Pt thanked me for calling

## 2023-06-29 ENCOUNTER — Ambulatory Visit (INDEPENDENT_AMBULATORY_CARE_PROVIDER_SITE_OTHER): Payer: Self-pay | Admitting: Neurology

## 2023-06-29 DIAGNOSIS — R200 Anesthesia of skin: Secondary | ICD-10-CM

## 2023-06-29 DIAGNOSIS — Z0289 Encounter for other administrative examinations: Secondary | ICD-10-CM

## 2023-06-29 DIAGNOSIS — R202 Paresthesia of skin: Secondary | ICD-10-CM

## 2023-06-29 NOTE — Patient Instructions (Addendum)
She has still noticed some tingling but nothing really bothersome. Legs don;t really bother her unless she sits for a long time and getting up and moving around makes it better. No radicular symptoms in the uppers or lowers. MRi cervical spine:  IMPRESSION: This is a normal MRI of the cervical spine without contrast.

## 2023-06-30 NOTE — Progress Notes (Addendum)
Full Name: Susan Hines Gender: Female MRN #: [id number] Date of Birth: 1991/01/22    Visit Date: 06/29/2023 14:44 Age: 32 Years Examining Physician: Dr. Naomie Dean Referring Physician: Dr. Naomie Dean Height: 5 feet 0 inch  History: She has still noticed some tingling but nothing really bothersome. Legs don;t really bother her unless she sits for a long time and getting up and moving around makes it better. No radicular symptoms in the uppers or lowers. MRi cervical spine: IMPRESSION: This is a normal MRI of the cervical spine without contrast.   Summary:  Nerve Conduction Studies were performed on the bilateral upper and left lower extremities: The right median/ulnar (palm) comparison nerve showed prolonged distal peak latency (Median Palm, 2.5 ms, N<2.2) and abnormal peak latency difference (Median Palm-Ulnar Palm, 1.0 ms, N<0.4) with a relative median delay.  All remaining nerves (as indicated in the following tables) were within normal limits. All remaining muscles (as indicated in the following tables) were within normal limits.        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 Clinical correlation suggested.  Discussed conservative measures and follow up if needed.   ------------------------------- Naomie Dean, M.D.  [facility] [address] [place], [place] [address] Tel: [phone] Fax: [phone]  Verbal informed consent was obtained from the patient, patient was informed of potential risk of procedure, including bruising, bleeding, hematoma formation, infection, muscle weakness, muscle pain, numbness, among others.        MNC    Nerve / Sites Muscle Latency Ref. Amplitude Ref. Rel Amp Segments Distance Velocity Ref. Area    ms  ms mV mV %  cm m/s m/s mVms  L Median - APB     Wrist APB 2.8 <=4.4 10.8 >=4.0 100 Wrist - APB 7   41.0     Upper arm APB 6.1  10.7  99.2 Upper arm - Wrist 21 63 >=49 39.9  R Median - APB     Wrist APB 3.5 <=4.4 9.2 >=4.0 100 Wrist - APB 7   37.9     Upper arm APB 6.9  8.9  97 Upper arm - Wrist 21 63 >=49 36.7  L Ulnar - ADM     Wrist ADM 2.2 <=3.3 9.8 >=6.0 100 Wrist - ADM 7   34.1     B.Elbow ADM 3.6  7.5  76.6 B.Elbow - Wrist 9 65 >=49 26.1     A.Elbow ADM 5.9  9.5  127 A.Elbow - B.Elbow 14 61 >=49 33.7  R Ulnar - ADM     Wrist ADM 2.1 <=3.3 9.2 >=6.0 100 Wrist - ADM 7   28.7     B.Elbow ADM 3.3  8.7  94.6 B.Elbow - Wrist 8 70 >=49 27.1     A.Elbow ADM 5.9  8.9  103 A.Elbow - B.Elbow 16 61 >=49 27.1  L Peroneal - EDB     Ankle EDB 4.3 <=6.5 6.9 >=2.0 100 Ankle - EDB 9   20.9     Fib head EDB 8.3  6.3  91.6 Fib head - Ankle 23 57 >=44 20.2     Pop fossa EDB 9.6  6.2  99.3 Pop fossa - Fib head 9 66 >=44 19.4         Pop fossa - Ankle  L Tibial - AH     Ankle AH 4.3 <=5.8 14.8 >=4.0 100 Ankle - AH 9   28.1     Pop fossa AH 10.3  10.6  72 Pop fossa - Ankle 31 52 >=41 24.3                 SNC    Nerve / Sites Rec. Site Peak Lat Ref.  Amp Ref. Segments Distance Peak Diff Ref.    ms ms V V  cm ms ms  L Sural - Ankle (Calf)     Calf Ankle 2.7 <=4.4 27 >=6 Calf - Ankle 10    L Superficial peroneal - Ankle     Lat leg Ankle 3.3 <=4.4 26 >=6 Lat leg - Ankle 14    L Median, Ulnar - Transcarpal comparison     Median Palm Wrist 1.9 <=2.2 77 >=35 Median Palm - Wrist 8       Ulnar Palm Wrist 2.1 <=2.2 18 >=12 Ulnar Palm - Wrist 8          Median Palm - Ulnar Palm  -0.3 <=0.4  R Median, Ulnar - Transcarpal comparison     Median Palm Wrist 2.5 <=2.2 42 >=35 Median Palm - Wrist 8       Ulnar Palm Wrist 1.5 <=2.2 37 >=12 Ulnar Palm - Wrist 8          Median Palm - Ulnar Palm  1.0 <=0.4  L Median - Orthodromic (Dig II, Mid palm)     Dig II Wrist 2.6 <=3.4 31 >=10 Dig II - Wrist 13    R  Median - Orthodromic (Dig II, Mid palm)     Dig II Wrist 3.2 <=3.4 14 >=10 Dig II - Wrist 13    R Ulnar - Orthodromic, (Dig V, Mid palm)     Dig V Wrist 2.3 <=3.1 17 >=5 Dig V - Wrist 63                     F  Wave    Nerve F Lat Ref.   ms ms  L Tibial - AH 36.9 <=56.0  L Ulnar - ADM 21.4 <=32.0  R Ulnar - ADM 21.5 <=32.0           EMG Summary Table    Spontaneous MUAP Recruitment  Muscle IA Fib PSW Fasc Other Amp Dur. Poly Pattern  R. Cervical paraspinals (low) Normal None None None _______ Normal Normal Normal Normal  R. Deltoid Normal None None None _______ Normal Normal Normal Normal  R. Triceps brachii Normal None None None _______ Normal Normal Normal Normal  R. Pronator teres Normal None None None _______ Normal Normal Normal Normal  R. Opponens pollicis Normal None None None _______ Normal Normal Normal Normal  R. First dorsal interosseous Normal None None None _______ Normal Normal Normal Normal

## 2023-06-30 NOTE — Progress Notes (Signed)
See procedure note.

## 2023-07-05 NOTE — Procedures (Signed)
Full Name: Susan Hines Gender: Female MRN #: [id number] Date of Birth: [birth year]    Visit Date: 06/29/2023 14:44 Age: 32 Years Examining Physician: Dr. Naomie Dean Referring Physician: Dr. Naomie Dean Height: 5 feet 0 inch  History: She has still noticed some tingling but nothing really bothersome. Legs don;t really bother her unless she sits for a long time and getting up and moving around makes it better. No radicular symptoms in the uppers or lowers. MRi cervical spine: IMPRESSION: This is a normal MRI of the cervical spine without contrast.   Summary:  Nerve Conduction Studies were performed on the bilateral upper extremities: The right median/ulnar (palm) comparison nerve showed prolonged distal peak latency (Median Palm, 2.5 ms, N<2.2) and abnormal peak latency difference (Median Palm-Ulnar Palm, 1.0 ms, N<0.4) with a relative median delay.     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 Clinical correlation suggested.  Discussed conservative measures and follow up if needed.   ------------------------------- Naomie Dean, M.D.  [facility] [address], [address] [place], [place] [address] Tel: [phone] Fax: [phone]  Verbal informed consent was obtained from the patient, patient was informed of potential risk of procedure, including bruising, bleeding, hematoma formation, infection, muscle weakness, muscle pain, numbness, among others.        MNC    Nerve / Sites Muscle Latency Ref. Amplitude Ref. Rel Amp Segments Distance Velocity Ref. Area    ms ms mV mV %  cm m/s m/s mVms  L Median - APB     Wrist APB 2.8 <=4.4 10.8 >=4.0 100 Wrist - APB 7   41.0     Upper arm APB 6.1  10.7  99.2 Upper arm - Wrist 21 63 >=49 39.9  R Median - APB      Wrist APB 3.5 <=4.4 9.2 >=4.0 100 Wrist - APB 7   37.9     Upper arm APB 6.9  8.9  97 Upper arm - Wrist 21 63 >=49 36.7  L Ulnar - ADM     Wrist ADM 2.2 <=3.3 9.8 >=6.0 100 Wrist - ADM 7   34.1     B.Elbow ADM 3.6  7.5  76.6 B.Elbow - Wrist 9 65 >=49 26.1     A.Elbow ADM 5.9  9.5  127 A.Elbow - B.Elbow 14 61 >=49 33.7  R Ulnar - ADM     Wrist ADM 2.1 <=3.3 9.2 >=6.0 100 Wrist - ADM 7   28.7     B.Elbow ADM 3.3  8.7  94.6 B.Elbow - Wrist 8 70 >=49 27.1     A.Elbow ADM 5.9  8.9  103 A.Elbow - B.Elbow 16 61 >=49 27.1  L Peroneal - EDB     Ankle EDB 4.3 <=6.5 6.9 >=2.0 100 Ankle - EDB 9   20.9     Fib head EDB 8.3  6.3  91.6 Fib head - Ankle 23 57 >=44 20.2     Pop fossa EDB 9.6  6.2  99.3 Pop fossa - Fib head 9 66 >=44 19.4         Pop fossa - Ankle      L Tibial - AH     Ankle AH 4.3 <=5.8 14.8 >=4.0 100 Ankle - AH 9   28.1     Pop fossa  AH 10.3  10.6  72 Pop fossa - Ankle 31 52 >=41 24.3                 SNC    Nerve / Sites Rec. Site Peak Lat Ref.  Amp Ref. Segments Distance Peak Diff Ref.    ms ms V V  cm ms ms  L Sural - Ankle (Calf)     Calf Ankle 2.7 <=4.4 27 >=6 Calf - Ankle 10    L Superficial peroneal - Ankle     Lat leg Ankle 3.3 <=4.4 26 >=6 Lat leg - Ankle 14    L Median, Ulnar - Transcarpal comparison     Median Palm Wrist 1.9 <=2.2 77 >=35 Median Palm - Wrist 8       Ulnar Palm Wrist 2.1 <=2.2 18 >=12 Ulnar Palm - Wrist 8          Median Palm - Ulnar Palm  -0.3 <=0.4  R Median, Ulnar - Transcarpal comparison     Median Palm Wrist 2.5 <=2.2 42 >=35 Median Palm - Wrist 8       Ulnar Palm Wrist 1.5 <=2.2 37 >=12 Ulnar Palm - Wrist 8          Median Palm - Ulnar Palm  1.0 <=0.4  L Median - Orthodromic (Dig II, Mid palm)     Dig II Wrist 2.6 <=3.4 31 >=10 Dig II - Wrist 13    R Median - Orthodromic (Dig II, Mid palm)     Dig II Wrist 3.2 <=3.4 14 >=10 Dig II - Wrist 13    R Ulnar - Orthodromic, (Dig V, Mid palm)     Dig V Wrist 2.3 <=3.1 17 >=5 Dig V - Wrist 60                      F  Wave    Nerve F Lat Ref.   ms ms  L Tibial - AH 36.9 <=56.0  L Ulnar - ADM 21.4 <=32.0  R Ulnar - ADM 21.5 <=32.0           EMG Summary Table    Spontaneous MUAP Recruitment  Muscle IA Fib PSW Fasc Other Amp Dur. Poly Pattern  R. Cervical paraspinals (low) Normal None None None _______ Normal Normal Normal Normal  R. Deltoid Normal None None None _______ Normal Normal Normal Normal  R. Triceps brachii Normal None None None _______ Normal Normal Normal Normal  R. Pronator teres Normal None None None _______ Normal Normal Normal Normal  R. Opponens pollicis Normal None None None _______ Normal Normal Normal Normal  R. First dorsal interosseous Normal None None None _______ Normal Normal Normal Normal

## 2023-07-29 DIAGNOSIS — F909 Attention-deficit hyperactivity disorder, unspecified type: Secondary | ICD-10-CM

## 2023-07-29 HISTORY — DX: Attention-deficit hyperactivity disorder, unspecified type: F90.9

## 2024-07-18 ENCOUNTER — Ambulatory Visit: Payer: Self-pay | Admitting: Obstetrics and Gynecology

## 2024-07-18 ENCOUNTER — Encounter: Payer: Self-pay | Admitting: Obstetrics and Gynecology

## 2024-07-18 VITALS — BP 121/81 | HR 99 | Ht 59.0 in | Wt 164.6 lb

## 2024-07-18 DIAGNOSIS — R350 Frequency of micturition: Secondary | ICD-10-CM

## 2024-07-18 DIAGNOSIS — N816 Rectocele: Secondary | ICD-10-CM

## 2024-07-18 LAB — POCT URINALYSIS DIP (CLINITEK)
Bilirubin, UA: NEGATIVE
Glucose, UA: NEGATIVE mg/dL
Ketones, POC UA: NEGATIVE mg/dL
Leukocytes, UA: NEGATIVE
Nitrite, UA: NEGATIVE
POC PROTEIN,UA: NEGATIVE
Spec Grav, UA: 1.02
Urobilinogen, UA: 0.2 U/dL
pH, UA: 6

## 2024-07-18 NOTE — Progress Notes (Signed)
 " New Patient Evaluation and Consultation  Referring Provider: Linnell Devere BRAVO, MD PCP: Brookes, Tyesha Jacinda, FNP Date of Service: 07/18/2024  SUBJECTIVE Chief Complaint: New Patient (Initial Visit) Susan Hines is a 33 y.o. female is here for rectocele)  History of Present Illness: Susan Hines is a 33 y.o. White or Caucasian female seen in consultation at the request of Dr Linnell for evaluation of prolapse.     Urinary Symptoms: Does not leak urine.   Day time voids 5.  Nocturia: 0-1 times per night to void. Voiding dysfunction:  empties bladder well.  Patient does not use a catheter to empty bladder.  When urinating, patient feels dribbling after finishing and the need to urinate multiple times in a row  UTIs: 0 UTI's in the last year.   Reports history of kidney stone about a year ago- passed on its own.    Pelvic Organ Prolapse Symptoms:                  Patient Admits to a feeling of a bulge the vaginal area. Patient Admits to seeing a bulge.  This bulge is bothersome. Last child was born in 2018- noticed a bulge after that and had difficutly placing tampons. Worse in the last 3 months, started to splint She is not sure if she wants more children.  Has not had prior treatment.  Has noticed that tissue is becoming more firm that is protruding   Bowel Symptom: Bowel movements: 1-3 time(s) per day Stool consistency: soft  Straining: yes.  Splinting: yes.  Incomplete evacuation: yes.  Patient Denies accidental bowel leakage / fecal incontinence Bowel regimen: none   Sexual Function Sexually active: yes.  Sexual orientation: heterosexual Pain with sex: Yes, at the vaginal opening, deep in the pelvis  Pelvic Pain Denies pelvic pain  Past Medical History:  Past Medical History:  Diagnosis Date   ADHD (attention deficit hyperactivity disorder) 2025   Anemia    Anxiety    Bacterial vaginosis    Chronic idiopathic urticaria    GERD (gastroesophageal  reflux disease)    H. pylori infection 06/2016   Headache(784.0)    Hx of varicella    Hyperemesis    Infection    IUGR (intrauterine growth restriction) 06/16/2013   Kidney stone    Migraines    Paroxysmal tachycardia (HCC)    PONV (postoperative nausea and vomiting)    SVD (spontaneous vaginal delivery) 06/18/2013   Yeast cystitis      Past Surgical History:   Past Surgical History:  Procedure Laterality Date   LAPAROSCOPIC ABDOMINAL EXPLORATION  2006   to explore for endometriosis   WISDOM TOOTH EXTRACTION  2006     Past OB/GYN History: OB History  Gravida Para Term Preterm AB Living  2 2 2  0 0 1  SAB IAB Ectopic Multiple Live Births  0 0 0 0 1    # Outcome Date GA Lbr Len/2nd Weight Sex Type Anes PTL Lv  2 Term 06/18/13 [redacted]w[redacted]d 19:05 / 01:01 6 lb 1.5 oz (2.765 kg) F Vag-Spont EPI  LIV  1 Term      Vag-Spont      Regular periods Contraception: withdrawal, cycle tracking. Last pap smear was 11/2022- neg.  Any history of abnormal pap smears: yes.  Medications: Patient has a current medication list which includes the following prescription(s): atomoxetine, escitalopram, famotidine , ibuprofen , and loratadine.   Allergies: Patient is allergic to augmentin [amoxicillin-pot clavulanate].   Social History: Social History[1]  Relationship status: married Patient lives with husband and 2 children.   Patient is not employed. Regular exercise: No History of abuse: No  Family History:   Family History  Problem Relation Age of Onset   Hyperlipidemia Mother    Hypertension Mother    Heart attack Father    Allergic rhinitis Sister    Bronchitis Sister    Heart disease Maternal Grandmother    Heart disease Maternal Grandfather    Cancer Paternal Grandmother    Breast cancer Paternal Grandmother    Eczema Daughter    Asthma Daughter    Eczema Son    Urticaria Neg Hx    Immunodeficiency Neg Hx    Atopy Neg Hx    Angioedema Neg Hx      Review of Systems: Review  of Systems  Constitutional:  Negative for fever, malaise/fatigue and weight loss.  Respiratory:  Negative for cough, shortness of breath and wheezing.   Cardiovascular:  Positive for palpitations. Negative for chest pain and leg swelling.  Gastrointestinal:  Negative for abdominal pain and blood in stool.  Genitourinary:  Negative for dysuria.  Musculoskeletal:  Negative for myalgias.  Skin:  Negative for rash.  Neurological:  Negative for dizziness and headaches.  Endo/Heme/Allergies:  Does not bruise/bleed easily.  Psychiatric/Behavioral:  Negative for depression. The patient is nervous/anxious.      OBJECTIVE Physical Exam: Vitals:   07/18/24 1509  BP: 121/81  Pulse: 99  Weight: 164 lb 9.6 oz (74.7 kg)  Height: 4' 11 (1.499 m)    Physical Exam Vitals reviewed. Exam conducted with a chaperone present.  Constitutional:      General: She is not in acute distress. Pulmonary:     Effort: Pulmonary effort is normal.  Abdominal:     General: There is no distension.     Palpations: Abdomen is soft.     Tenderness: There is no abdominal tenderness. There is no rebound.  Musculoskeletal:        General: No swelling. Normal range of motion.  Skin:    General: Skin is warm and dry.     Findings: No rash.  Neurological:     Mental Status: She is alert and oriented to person, place, and time.  Psychiatric:        Mood and Affect: Mood normal.        Behavior: Behavior normal.      GU / Detailed Urogynecologic Evaluation:  Pelvic Exam: Normal external female genitalia; Bartholin's and Skene's glands normal in appearance; urethral meatus normal in appearance, no urethral masses or discharge.   CST: negative  Speculum exam reveals normal vaginal mucosa without atrophy. Cervix normal appearance. Uterus normal single, nontender. Adnexa no mass, fullness, tenderness.    Pelvic floor strength II/V, puborectalis IV/V external anal sphincter IV/V  Pelvic floor musculature: Right  levator non-tender, Right obturator non-tender, Left levator non-tender, Left obturator non-tender  POP-Q:   POP-Q  -1.5                                            Aa   -1.5                                           Ba  -6  C   4.5                                            Gh  4                                            Pb  11                                            tvl   0                                            Ap  0                                            Bp  -10                                              D      Rectal Exam:  Normal sphincter tone, moderate distal rectocele, enterocoele not present, no rectal masses, no sign of dyssynergia when asking the patient to bear down.  Post-Void Residual (PVR) by Bladder Scan: In order to evaluate bladder emptying, we discussed obtaining a postvoid residual and patient agreed to this procedure.  Procedure: The ultrasound unit was placed on the patient's abdomen in the suprapubic region after the patient had voided.    Post Void Residual - 07/18/24 1523       Post Void Residual   Post Void Residual 128 mL           Laboratory Results: Lab Results  Component Value Date   COLORU yellow 07/18/2024   CLARITYU clear 07/18/2024   GLUCOSEUR negative 07/18/2024   BILIRUBINUR negative 07/18/2024   SPECGRAV 1.020 07/18/2024   RBCUR trace-intact (A) 07/18/2024   PHUR 6.0 07/18/2024   PROTEINUR NEGATIVE 07/31/2022   UROBILINOGEN 0.2 07/18/2024   LEUKOCYTESUR Negative 07/18/2024    Lab Results  Component Value Date   CREATININE 0.76 08/02/2021   CREATININE 0.59 04/22/2013    Lab Results  Component Value Date   HGBA1C 5.3 04/29/2023    Lab Results  Component Value Date   HGB 13.5 08/02/2021     ASSESSMENT AND PLAN Ms. Boyett is a 33 y.o. with:  1. Prolapse of posterior vaginal wall   2. Urinary frequency     Prolapse of posterior vaginal  wall Assessment & Plan: Stage I anterior, Stage II posterior, Stage I apical prolapse - For treatment of pelvic organ prolapse, we discussed options for management including expectant management, conservative management, and surgical management, such as Kegels, a pessary, pelvic floor physical therapy, and specific surgical procedures (posterior repair and perineorrhaphy) - She may be interested in having more children so recommended to complete childbearing before undergoing  surgery.  - She is interested in a pessary and pelvic PT. Will have her return for a fitting.   Orders: -     AMB referral to rehabilitation  Urinary frequency -     POCT URINALYSIS DIP (CLINITEK)  Return for pessary fitting   Rosaline LOISE Caper, MD        [1]  Social History Tobacco Use   Smoking status: Never   Smokeless tobacco: Never  Vaping Use   Vaping status: Never Used  Substance Use Topics   Alcohol use: Yes    Comment: occasional   Drug use: No   "

## 2024-07-18 NOTE — Assessment & Plan Note (Signed)
 Stage I anterior, Stage II posterior, Stage I apical prolapse - For treatment of pelvic organ prolapse, we discussed options for management including expectant management, conservative management, and surgical management, such as Kegels, a pessary, pelvic floor physical therapy, and specific surgical procedures (posterior repair and perineorrhaphy) - She may be interested in having more children so recommended to complete childbearing before undergoing surgery.  - She is interested in a pessary and pelvic PT. Will have her return for a fitting.

## 2024-07-18 NOTE — Patient Instructions (Signed)
You have a stage 2 (out of 4) prolapse.  We discussed the fact that it is not life threatening but there are several treatment options. For treatment of pelvic organ prolapse, we discussed options for management including expectant management, conservative management, and surgical management, such as Kegels, a pessary, pelvic floor physical therapy, and specific surgical procedures.

## 2024-07-29 ENCOUNTER — Ambulatory Visit: Payer: Self-pay | Admitting: Obstetrics and Gynecology

## 2024-08-08 ENCOUNTER — Ambulatory Visit: Payer: Self-pay | Admitting: Obstetrics and Gynecology

## 2024-08-08 ENCOUNTER — Encounter: Payer: Self-pay | Admitting: *Deleted

## 2024-08-09 ENCOUNTER — Ambulatory Visit: Payer: Self-pay | Admitting: Obstetrics and Gynecology

## 2024-08-11 ENCOUNTER — Ambulatory Visit: Payer: Self-pay | Admitting: Obstetrics

## 2024-09-15 ENCOUNTER — Ambulatory Visit: Payer: MEDICAID | Admitting: Physical Therapy
